# Patient Record
Sex: Male | Born: 1970
Health system: Southern US, Community
[De-identification: ages and names within clinical notes are randomized; demographics above are authoritative.]

## PROBLEM LIST (undated history)

## (undated) DIAGNOSIS — I10 Essential (primary) hypertension: Secondary | ICD-10-CM

## (undated) DIAGNOSIS — E7800 Pure hypercholesterolemia, unspecified: Secondary | ICD-10-CM

---

## 2009-05-30 ENCOUNTER — Encounter: Admission: RE | Admit: 2009-05-30 | Discharge: 2009-08-15 | Payer: Self-pay | Admitting: Family Medicine

## 2010-09-13 ENCOUNTER — Emergency Department (HOSPITAL_BASED_OUTPATIENT_CLINIC_OR_DEPARTMENT_OTHER): Admission: EM | Admit: 2010-09-13 | Discharge: 2010-09-13 | Payer: Self-pay | Admitting: Emergency Medicine

## 2010-09-13 ENCOUNTER — Ambulatory Visit: Payer: Self-pay | Admitting: Diagnostic Radiology

## 2011-03-14 LAB — URINALYSIS, ROUTINE W REFLEX MICROSCOPIC
Glucose, UA: NEGATIVE mg/dL
Ketones, ur: NEGATIVE mg/dL
Nitrite: NEGATIVE
Specific Gravity, Urine: 1.006 (ref 1.005–1.030)
pH: 7 (ref 5.0–8.0)

## 2011-03-14 LAB — BASIC METABOLIC PANEL
Chloride: 101 mEq/L (ref 96–112)
GFR calc Af Amer: >60 mL/min (ref >60)
GFR calc non Af Amer: >60 mL/min (ref >60)
Potassium: 4.3 mEq/L (ref 3.5–5.1)
Sodium: 144 mEq/L (ref 135–145)

## 2017-03-21 DIAGNOSIS — Z125 Encounter for screening for malignant neoplasm of prostate: Secondary | ICD-10-CM | POA: Diagnosis not present

## 2017-04-17 DIAGNOSIS — E785 Hyperlipidemia, unspecified: Secondary | ICD-10-CM | POA: Diagnosis not present

## 2017-04-17 DIAGNOSIS — E291 Testicular hypofunction: Secondary | ICD-10-CM | POA: Diagnosis not present

## 2017-04-17 DIAGNOSIS — I10 Essential (primary) hypertension: Secondary | ICD-10-CM | POA: Diagnosis not present

## 2017-05-22 DIAGNOSIS — E291 Testicular hypofunction: Secondary | ICD-10-CM | POA: Diagnosis not present

## 2017-08-19 DIAGNOSIS — E785 Hyperlipidemia, unspecified: Secondary | ICD-10-CM | POA: Diagnosis not present

## 2017-08-19 DIAGNOSIS — I10 Essential (primary) hypertension: Secondary | ICD-10-CM | POA: Diagnosis not present

## 2017-08-19 DIAGNOSIS — Z125 Encounter for screening for malignant neoplasm of prostate: Secondary | ICD-10-CM | POA: Diagnosis not present

## 2017-08-19 DIAGNOSIS — E291 Testicular hypofunction: Secondary | ICD-10-CM | POA: Diagnosis not present

## 2017-08-19 DIAGNOSIS — Z23 Encounter for immunization: Secondary | ICD-10-CM | POA: Diagnosis not present

## 2017-11-13 DIAGNOSIS — E291 Testicular hypofunction: Secondary | ICD-10-CM | POA: Diagnosis not present

## 2018-02-17 DIAGNOSIS — M9901 Segmental and somatic dysfunction of cervical region: Secondary | ICD-10-CM | POA: Diagnosis not present

## 2018-02-17 DIAGNOSIS — M542 Cervicalgia: Secondary | ICD-10-CM | POA: Diagnosis not present

## 2018-02-17 DIAGNOSIS — M9902 Segmental and somatic dysfunction of thoracic region: Secondary | ICD-10-CM | POA: Diagnosis not present

## 2018-03-04 DIAGNOSIS — I10 Essential (primary) hypertension: Secondary | ICD-10-CM | POA: Diagnosis not present

## 2018-03-04 DIAGNOSIS — E785 Hyperlipidemia, unspecified: Secondary | ICD-10-CM | POA: Diagnosis not present

## 2018-09-04 DIAGNOSIS — E291 Testicular hypofunction: Secondary | ICD-10-CM | POA: Diagnosis not present

## 2018-09-04 DIAGNOSIS — I10 Essential (primary) hypertension: Secondary | ICD-10-CM | POA: Diagnosis not present

## 2018-09-04 DIAGNOSIS — E785 Hyperlipidemia, unspecified: Secondary | ICD-10-CM | POA: Diagnosis not present

## 2018-12-24 DIAGNOSIS — R208 Other disturbances of skin sensation: Secondary | ICD-10-CM | POA: Diagnosis not present

## 2018-12-24 DIAGNOSIS — M7741 Metatarsalgia, right foot: Secondary | ICD-10-CM | POA: Diagnosis not present

## 2019-01-08 DIAGNOSIS — R208 Other disturbances of skin sensation: Secondary | ICD-10-CM | POA: Diagnosis not present

## 2019-02-02 DIAGNOSIS — R208 Other disturbances of skin sensation: Secondary | ICD-10-CM | POA: Diagnosis not present

## 2019-02-02 DIAGNOSIS — M7741 Metatarsalgia, right foot: Secondary | ICD-10-CM | POA: Diagnosis not present

## 2019-03-03 DIAGNOSIS — E785 Hyperlipidemia, unspecified: Secondary | ICD-10-CM | POA: Diagnosis not present

## 2019-03-03 DIAGNOSIS — E291 Testicular hypofunction: Secondary | ICD-10-CM | POA: Diagnosis not present

## 2019-03-03 DIAGNOSIS — I10 Essential (primary) hypertension: Secondary | ICD-10-CM | POA: Diagnosis not present

## 2019-05-03 DIAGNOSIS — R079 Chest pain, unspecified: Secondary | ICD-10-CM | POA: Diagnosis not present

## 2019-05-03 DIAGNOSIS — I10 Essential (primary) hypertension: Secondary | ICD-10-CM | POA: Diagnosis not present

## 2019-05-03 DIAGNOSIS — R0600 Dyspnea, unspecified: Secondary | ICD-10-CM | POA: Diagnosis not present

## 2019-05-04 ENCOUNTER — Other Ambulatory Visit: Payer: Self-pay

## 2019-05-04 ENCOUNTER — Emergency Department (HOSPITAL_BASED_OUTPATIENT_CLINIC_OR_DEPARTMENT_OTHER): Payer: Commercial Managed Care - PPO

## 2019-05-04 ENCOUNTER — Inpatient Hospital Stay (HOSPITAL_BASED_OUTPATIENT_CLINIC_OR_DEPARTMENT_OTHER)
Admission: EM | Admit: 2019-05-04 | Discharge: 2019-05-11 | DRG: 234 | Disposition: A | Discharge status: Home / Self Care | Payer: Commercial Managed Care - PPO | Attending: Surgery | Admitting: Surgery

## 2019-05-04 ENCOUNTER — Encounter (HOSPITAL_BASED_OUTPATIENT_CLINIC_OR_DEPARTMENT_OTHER): Payer: Self-pay | Admitting: Emergency Medicine

## 2019-05-04 DIAGNOSIS — I214 Non-ST elevation (NSTEMI) myocardial infarction: Secondary | ICD-10-CM

## 2019-05-04 DIAGNOSIS — Z888 Allergy status to other drugs, medicaments and biological substances status: Secondary | ICD-10-CM

## 2019-05-04 DIAGNOSIS — I10 Essential (primary) hypertension: Secondary | ICD-10-CM | POA: Diagnosis present

## 2019-05-04 DIAGNOSIS — Z01811 Encounter for preprocedural respiratory examination: Secondary | ICD-10-CM

## 2019-05-04 DIAGNOSIS — E669 Obesity, unspecified: Secondary | ICD-10-CM | POA: Diagnosis present

## 2019-05-04 DIAGNOSIS — R079 Chest pain, unspecified: Secondary | ICD-10-CM | POA: Diagnosis present

## 2019-05-04 DIAGNOSIS — E7800 Pure hypercholesterolemia, unspecified: Secondary | ICD-10-CM | POA: Diagnosis present

## 2019-05-04 DIAGNOSIS — Z20828 Contact with and (suspected) exposure to other viral communicable diseases: Secondary | ICD-10-CM | POA: Diagnosis present

## 2019-05-04 DIAGNOSIS — D62 Acute posthemorrhagic anemia: Secondary | ICD-10-CM | POA: Diagnosis not present

## 2019-05-04 DIAGNOSIS — Z8249 Family history of ischemic heart disease and other diseases of the circulatory system: Secondary | ICD-10-CM | POA: Diagnosis not present

## 2019-05-04 DIAGNOSIS — R739 Hyperglycemia, unspecified: Secondary | ICD-10-CM

## 2019-05-04 DIAGNOSIS — Z6829 Body mass index (BMI) 29.0-29.9, adult: Secondary | ICD-10-CM | POA: Diagnosis not present

## 2019-05-04 DIAGNOSIS — Z79899 Other long term (current) drug therapy: Secondary | ICD-10-CM | POA: Diagnosis not present

## 2019-05-04 DIAGNOSIS — I200 Unstable angina: Secondary | ICD-10-CM

## 2019-05-04 DIAGNOSIS — Z951 Presence of aortocoronary bypass graft: Secondary | ICD-10-CM

## 2019-05-04 DIAGNOSIS — E785 Hyperlipidemia, unspecified: Secondary | ICD-10-CM | POA: Diagnosis present

## 2019-05-04 DIAGNOSIS — Z09 Encounter for follow-up examination after completed treatment for conditions other than malignant neoplasm: Secondary | ICD-10-CM

## 2019-05-04 DIAGNOSIS — E876 Hypokalemia: Secondary | ICD-10-CM | POA: Diagnosis present

## 2019-05-04 DIAGNOSIS — R0602 Shortness of breath: Secondary | ICD-10-CM | POA: Diagnosis not present

## 2019-05-04 DIAGNOSIS — R42 Dizziness and giddiness: Secondary | ICD-10-CM | POA: Diagnosis not present

## 2019-05-04 DIAGNOSIS — I25110 Atherosclerotic heart disease of native coronary artery with unstable angina pectoris: Secondary | ICD-10-CM | POA: Diagnosis present

## 2019-05-04 DIAGNOSIS — R0981 Nasal congestion: Secondary | ICD-10-CM | POA: Diagnosis not present

## 2019-05-04 DIAGNOSIS — E1165 Type 2 diabetes mellitus with hyperglycemia: Secondary | ICD-10-CM | POA: Diagnosis not present

## 2019-05-04 HISTORY — DX: Pure hypercholesterolemia, unspecified: E78.00

## 2019-05-04 HISTORY — DX: Essential (primary) hypertension: I10

## 2019-05-04 LAB — HEMOGLOBIN A1C
Hgb A1c MFr Bld: 5.2 % (ref 4.8–5.6)
Mean Plasma Glucose: 102.54 mg/dL

## 2019-05-04 LAB — TROPONIN I
Troponin I: <0.03 ng/mL (ref <0.03)
Troponin I: 0.1 ng/mL (ref <0.03)
Troponin I: 0.09 ng/mL (ref <0.03)

## 2019-05-04 LAB — CBC
HCT: 47.2 % (ref 39.0–52.0)
Hemoglobin: 15.5 g/dL (ref 13.0–17.0)
MCH: 26 pg (ref 26.0–34.0)
MCHC: 32.8 g/dL (ref 30.0–36.0)
MCV: 79.1 fL — ABNORMAL LOW (ref 80.0–100.0)
Platelets: 268 10*3/uL (ref 150–400)
RBC: 5.97 MIL/uL — ABNORMAL HIGH (ref 4.22–5.81)
RDW: 13.1 % (ref 11.5–15.5)
WBC: 7.5 10*3/uL (ref 4.0–10.5)
nRBC: 0 % (ref 0.0–0.2)

## 2019-05-04 LAB — BASIC METABOLIC PANEL
Anion gap: 14 (ref 5–15)
BUN: 13 mg/dL (ref 6–20)
CO2: 20 mmol/L — ABNORMAL LOW (ref 22–32)
Calcium: 9.5 mg/dL (ref 8.9–10.3)
Chloride: 103 mmol/L (ref 98–111)
Creatinine, Ser: 1.03 mg/dL (ref 0.61–1.24)
GFR calc Af Amer: >60 mL/min (ref >60)
GFR calc non Af Amer: >60 mL/min (ref >60)
Glucose, Bld: 143 mg/dL — ABNORMAL HIGH (ref 70–99)
Potassium: 3.3 mmol/L — ABNORMAL LOW (ref 3.5–5.1)
Sodium: 137 mmol/L (ref 135–145)

## 2019-05-04 LAB — RAPID URINE DRUG SCREEN, HOSP PERFORMED
Amphetamines: NOT DETECTED
Barbiturates: NOT DETECTED
Benzodiazepines: NOT DETECTED
Cocaine: NOT DETECTED
Opiates: NOT DETECTED
Tetrahydrocannabinol: NOT DETECTED

## 2019-05-04 LAB — SARS CORONAVIRUS 2 AG (30 MIN TAT): SARS Coronavirus 2 Ag: NEGATIVE

## 2019-05-04 LAB — GLUCOSE, CAPILLARY
Glucose-Capillary: 88 mg/dL (ref 70–99)
Glucose-Capillary: 89 mg/dL (ref 70–99)

## 2019-05-04 LAB — TSH: TSH: 1.804 u[IU]/mL (ref 0.350–4.500)

## 2019-05-04 MED ORDER — INSULIN ASPART 100 UNIT/ML ~~LOC~~ SOLN: 0.0000 [IU] | Freq: Three times a day (TID) | SUBCUTANEOUS | Status: DC

## 2019-05-04 MED ORDER — ONDANSETRON HCL 4 MG/2ML IJ SOLN: 4.0000 mg | Freq: Four times a day (QID) | INTRAMUSCULAR | Status: DC | PRN

## 2019-05-04 MED ORDER — HYDRALAZINE HCL 20 MG/ML IJ SOLN: 5.0000 mg | INTRAMUSCULAR | Status: DC | PRN

## 2019-05-04 MED ORDER — ACETAMINOPHEN 325 MG PO TABS: 650.0000 mg | ORAL_TABLET | ORAL | Status: DC | PRN

## 2019-05-04 MED ORDER — ATORVASTATIN CALCIUM 10 MG PO TABS
10.0000 mg | ORAL_TABLET | Freq: Every day | ORAL | Status: DC
  Administered 2019-05-04: 10 mg via ORAL
  Filled 2019-05-04: qty 1

## 2019-05-04 MED ORDER — LOSARTAN POTASSIUM 50 MG PO TABS
100.0000 mg | ORAL_TABLET | Freq: Every day | ORAL | Status: DC
  Administered 2019-05-05: 100 mg via ORAL
  Filled 2019-05-04 (×2): qty 2

## 2019-05-04 MED ORDER — ASPIRIN 81 MG PO CHEW
243.0000 mg | CHEWABLE_TABLET | Freq: Once | ORAL | Status: AC
  Administered 2019-05-04: 243 mg via ORAL
  Filled 2019-05-04: qty 3

## 2019-05-04 MED ORDER — AMLODIPINE BESYLATE 10 MG PO TABS
10.0000 mg | ORAL_TABLET | Freq: Every day | ORAL | Status: DC
  Administered 2019-05-05: 10 mg via ORAL
  Filled 2019-05-04: qty 1

## 2019-05-04 MED ORDER — ENOXAPARIN SODIUM 40 MG/0.4ML ~~LOC~~ SOLN
40.0000 mg | SUBCUTANEOUS | Status: DC
  Administered 2019-05-04: 40 mg via SUBCUTANEOUS
  Filled 2019-05-04: qty 0.4

## 2019-05-04 MED ORDER — ASPIRIN EC 81 MG PO TBEC
81.0000 mg | DELAYED_RELEASE_TABLET | Freq: Every day | ORAL | Status: DC
  Administered 2019-05-05: 81 mg via ORAL
  Filled 2019-05-04: qty 1

## 2019-05-04 MED ORDER — LOSARTAN POTASSIUM-HCTZ 100-12.5 MG PO TABS
1.0000 | ORAL_TABLET | Freq: Every day | ORAL | Status: DC
Start: 2019-05-05 — End: 2019-05-04

## 2019-05-04 MED ORDER — HYDROCHLOROTHIAZIDE 12.5 MG PO CAPS
12.5000 mg | ORAL_CAPSULE | Freq: Every day | ORAL | Status: DC
  Administered 2019-05-05: 12.5 mg via ORAL
  Filled 2019-05-04: qty 1

## 2019-05-04 MED ORDER — ATENOLOL 25 MG PO TABS
100.0000 mg | ORAL_TABLET | Freq: Every day | ORAL | Status: DC
  Administered 2019-05-05: 100 mg via ORAL
  Filled 2019-05-04: qty 4

## 2019-05-04 MED ORDER — MORPHINE SULFATE (PF) 2 MG/ML IV SOLN: 2.0000 mg | INTRAVENOUS | Status: DC | PRN

## 2019-05-04 MED ORDER — POTASSIUM CHLORIDE CRYS ER 20 MEQ PO TBCR
40.0000 meq | EXTENDED_RELEASE_TABLET | Freq: Once | ORAL | Status: AC
  Administered 2019-05-04: 40 meq via ORAL
  Filled 2019-05-04: qty 2

## 2019-05-04 NOTE — ED Notes (Signed)
ED Provider at bedside. 

## 2019-05-04 NOTE — ED Notes (Signed)
IV attempt x2 unsuccessful, blood obtained for labs.  RN aware

## 2019-05-04 NOTE — ED Provider Notes (Signed)
[facility] EMERGENCY DEPARTMENT Provider Note   CSN: [id number] Arrival date & time: 05/04/19  8144    History   Chief Complaint Chief Complaint  Patient presents with   Chest Pain    HPI Ezykiel Lynde is a 48 y.o. male.  HPI: A 48 year old patient with a history of hypertension, hypercholesterolemia and obesity presents for evaluation of chest pain. Initial onset of pain was less than one hour ago. The patient's chest pain is described as heaviness/pressure/tightness, is sharp and is worse with exertion. The patient's chest pain is middle- or left-sided, is not well-localized and does radiate to the arms/jaw/neck. The patient does not complain of nausea and denies diaphoresis. The patient has no history of stroke, has no history of peripheral artery disease, has not smoked in the past 90 days, denies any history of treated diabetes and has no relevant family history of coronary artery disease (first degree relative at less than age 37).   Mitchel Blache is a 48 y.o. male with a history of hypertension and hyperlipidemia, who presents to the emergency department for evaluation of chest pain.  Current chest pain episode started less than 1 hour ago, it began as he was walking a short distance to the car, we sat down in the car pain improved but did not completely resolve and he noticed radiation into his left hand and arm with some numbness and tingling as well as numbness and tingling up through the jaw and cheeks.  Patient has had some similar episodes of chest pain with exertion over the past 3 weeks but they typically resolve completely with rest, and he has never had the numbness and tingling up into his face and cheeks.  He did feel a bit short of breath and lightheaded when pain became severe, at maximum intensity reached a 9/10, now currently a 3/10.  He denies diaphoresis, nausea or vomiting.  Patient reports that he saw his primary care doctor regarding these episodes of chest pain  yesterday had a reassuring EKG at that time and was given outpatient cardiology referral, he was contacted today to schedule appointment but then had this worsening episode of chest pain this morning and presented to the emergency department.  He has never seen a cardiologist before he is on medication to manage his blood pressure and cholesterol, prior to these 3 weeks he has never had issues with chest pain.  No smoking history.  Denies drug use.  No significant family history of heart disease.     Past Medical History:  Diagnosis Date   High cholesterol    Hypertension     There are no active problems to display for this patient.   History reviewed. No pertinent surgical history.      Home Medications    Prior to Admission medications   Medication Sig Start Date End Date Taking? Authorizing Provider  testosterone (ANDROGEL) 50 MG/5GM (1%) GEL  10/09/18  Yes [provider]  amLODipine (NORVASC) 10 MG tablet  04/06/19   [provider]  atenolol (TENORMIN) 100 MG tablet  04/06/19   [provider]  atorvastatin (LIPITOR) 10 MG tablet Take by mouth.    [provider]  cyclobenzaprine (FLEXERIL) 10 MG tablet Take by mouth.    [provider]  losartan-hydrochlorothiazide (HYZAAR) 100-12.5 MG tablet Take by mouth.    [provider]    Family History No family history on file.  Social History Social History   Tobacco Use   Smoking  status: Never Smoker   Smokeless tobacco: Never Used  Substance Use Topics   Alcohol use: Never    Frequency: Never   Drug use: Never     Allergies   Azithromycin   Review of Systems Review of Systems  Constitutional: Negative for chills, diaphoresis, fatigue and fever.  HENT: Negative.   Eyes: Negative for visual disturbance.  Respiratory: Positive for shortness of breath. Negative for cough and chest tightness.   Cardiovascular: Positive for chest pain. Negative for  palpitations and leg swelling.  Gastrointestinal: Negative for abdominal pain, nausea and vomiting.  Genitourinary: Negative for dysuria and flank pain.  Musculoskeletal: Negative for arthralgias and myalgias.  Skin: Negative for color change and rash.  Neurological: Positive for light-headedness and numbness. Negative for dizziness, syncope, weakness and headaches.     Physical Exam Updated Vital Signs BP (!) 162/101 (BP Location: Right Arm)    Pulse 75    Temp (!) 97.5 F (36.4 C) (Oral)    Resp 12    Ht  (1.778 m)    Wt 96.2 kg    SpO2 100%    BMI 30.42 kg/m   Physical Exam Vitals signs and nursing note reviewed.  Constitutional:      General: He is not in acute distress.    Appearance: He is well-developed. He is obese. He is not ill-appearing, toxic-appearing or diaphoretic.  HENT:     Head: Normocephalic and atraumatic.  Eyes:     General:        Right eye: No discharge.        Left eye: No discharge.     Pupils: Pupils are equal, round, and reactive to light.  Neck:     Musculoskeletal: Neck supple.     Vascular: No JVD.     Trachea: No tracheal deviation.  Cardiovascular:     Rate and Rhythm: Normal rate and regular rhythm.     Pulses:          Radial pulses are 2+ on the right side and 2+ on the left side.       Dorsalis pedis pulses are 2+ on the right side and 2+ on the left side.     Heart sounds: Normal heart sounds. No murmur. No friction rub. No gallop.   Pulmonary:     Effort: Pulmonary effort is normal. No respiratory distress.     Breath sounds: Normal breath sounds. No wheezing or rales.     Comments: Respirations equal and unlabored, patient able to speak in full sentences, lungs clear to auscultation bilaterally Chest:     Chest wall: No tenderness.     Comments: Chest pain not reproducible with palpation Abdominal:     General: Bowel sounds are normal. There is no distension.     Palpations: Abdomen is soft. There is no mass.     Tenderness:  There is no abdominal tenderness. There is no guarding.  Musculoskeletal:        General: No deformity.  Skin:    General: Skin is warm and dry.     Capillary Refill: Capillary refill takes less than 2 seconds.  Neurological:     Mental Status: He is alert and oriented to person, place, and time.     Coordination: Coordination normal.     Comments: Speech is clear, able to follow commands Moves extremities without ataxia, coordination intact   Psychiatric:        Mood and Affect: Mood normal.  Behavior: Behavior normal.      ED Treatments / Results  Labs (all labs ordered are listed, but only abnormal results are displayed) Labs Reviewed  BASIC METABOLIC PANEL - Abnormal; Notable for the following components:      Result Value   Potassium 3.3 (*)    CO2 20 (*)    Glucose, Bld 143 (*)    All other components within normal limits  CBC - Abnormal; Notable for the following components:   RBC 5.97 (*)    MCV 79.1 (*)    All other components within normal limits  SARS CORONAVIRUS 2 (HOSP ORDER, PERFORMED IN [place] [facility] VIA ABBOTT ID)  TROPONIN I    EKG EKG Interpretation  Date/Time:  Tuesday May 04 2019 10:02:03 EDT Ventricular Rate:  79 PR Interval:    QRS Duration: 103 QT Interval:  412 QTC Calculation: 473 R Axis:   72 Text Interpretation:  Sinus rhythm Abnormal inferior Q waves Borderline ST depression, anterior leads Baseline wander in lead(s) V5 Confirmed by Vanetta Mulders [phone]) on 05/04/2019 10:09:42 AM   Radiology Dg Chest Port 1 View  Result Date: 05/04/2019 CLINICAL DATA:  Chest pain. EXAM: PORTABLE CHEST 1 VIEW COMPARISON:  None. FINDINGS: The heart size and mediastinal contours are within normal limits. Both lungs are clear. The visualized skeletal structures are unremarkable. IMPRESSION: No active disease. Electronically Signed   By: Obie Dredge M.D.   On: 05/04/2019 10:44    Procedures Procedures (including critical care  time)  Medications Ordered in ED Medications  potassium chloride SA (K-DUR) CR tablet 40 mEq (has no administration in time range)  aspirin chewable tablet 243 mg (243 mg Oral Given 05/04/19 1035)     Initial Impression / Assessment and Plan / ED Course  I have reviewed the triage vital signs and the nursing notes.  Pertinent labs & imaging results that were available during my care of the patient were reviewed by me and considered in my medical decision making (see chart for details).  Patient presents with 3 weeks of exertional chest pain, today had episode after walking a very short distance to the car that did not immediately resolve with rest and he began to have pain radiating into his left arm and through his neck to his cheeks with some numbness and tingling.  Since patient has been resting here in the emergency department pain is now a 1/10 and he has no further numbness or tingling.  He did not experience diaphoresis, nausea or vomiting.  Story is certainly concerning for potential unstable angina.  His EKG shows some borderline ST depressions in the anterior leads as well as some slight inferior Q waves but no ST elevations to suggest STEMI.  Otherwise shows sinus rhythm, no new bundle branch blocks.  Given that his pain has significantly improved, will check basic labs, troponin and chest x-ray and give additional 3 tablets of 81 mg aspirin since he took 1 just prior to arrival at home.  Patient does not have significant risk factors for PE and the story seems much more consistent with cardiac pain.  He does not have any epigastric tenderness, nausea or vomiting, he initially thought the symptoms may be related to reflux and has started daily Pepcid for this without improvement in his symptoms and I feel this is less likely.  Presentation not suggestive of aortic dissection.  No fever cough or persistent shortness of breath, no recent sick contacts.  Feel pneumonia is unlikely and patient  is  low risk for COVID-19 infection.  Patient does have multiple cardiac risk factors including hypertension, hyperlipidemia and obesity.  No smoking history.  HEART Score: 5  Initial work-up is reassuring, EKG as described above, chest x-ray shows no active cardiopulmonary disease, images reviewed by myself.  Initial troponin is negative.  No leukocytosis, normal hemoglobin, potassium is 3.3, mild hypokalemia treated with p.o. potassium replacement, glucose slightly elevated at 143, patient is not fasting, CO2 of 20.  Normal anion gap and normal renal function.  Initial work-up is reassuring but with heart score of 5 feel patient would benefit from admission for further cardiac work-up, I discussed case initially with Dr. Duke Salviaandolph with cardiology given patient's symptoms concerning for unstable angina but given negative enzymes and that patient is currently chest pain-free she did not feel that early cath was warranted at this time and recommended medicine admission for stress testing.  Will consult hospitalist for admission.  Discussed plan with patient, also called and updated patient's wife and they are in agreement.  Patient discussed with Dr. Ophelia CharterYates, who accepts the patient for transfer and [facility] pain [facility].  Final Clinical Impressions(s) / ED Diagnoses   Final diagnoses:  Unstable angina [facility](HCC)    ED Discharge Orders    None       Dartha LodgeFord, Josalynn Johndrow N, [place]-C 05/04/19 1144    Vanetta MuldersZackowski, Scott, MD 05/05/19 [phone]

## 2019-05-04 NOTE — H&P (Signed)
History and Physical    Edward Lawrence Stryker ZOX:[id number] DOB: 10/04/1971 DOA: 05/04/2019  PCP: Deatra JamesSun, Vyvyan, MD Consultants:  Dial - podiatry Patient coming from:  Home - lives with wife and 2 kids; NOK: Wife, Lawson FiscalLori, [phone]  Chief Complaint:  Chest pain  HPI: Edward Lawrence Perrell is a 48 y.o. male with medical history significant of HTN and HLD [facility].  He reports intermittent CP x 3 weeks.  He thought it was heart burn and changed his diet and starting taking antacids - Pepcid AC.  Sometimes the pain comes on and radiates into his arms while walking and he has to stop to take a break. This AM, he had pain while driving - substernal into the left arm/shoulder and left cheek tingling.  The pain has bene non-exertional at times.  This AM< he woke up with pain.  However, exercise or exertion will almost always elicit the pain.  No N/V.  ?SOB.   FH - father with HTN and valve replacement.  No h/o stress test.    ED Course:   [facility] for this 48 yo male with h/o HTN and HLD presenting with chest pain. No prior h/o CAD, has not seen cards. 3 weeks of exertional CP, referred for outpatient cardiology evaluation. Today, developed 9/10 CP with radiation while walking. Chest pain free after rest. EKG nonspecific. Troponin negative. Dr. Duke [facility]olph [facility] with plan for stress test. He has been given ASA.   Review of Systems: As per HPI; otherwise review of systems reviewed and negative.   Ambulatory Status:  Ambulates without assistance  Past Medical History:  Diagnosis Date  . High cholesterol   . Hypertension     History reviewed. No pertinent surgical history.  Social History   Socioeconomic History  . Marital status: Married    Spouse name: Not on file  . Number of children: Not on file  . Years of education: Not on file  . Highest education level: Not on file  Occupational History  . 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  Financial resource strain: Not on file  . Food insecurity:    Worry: Not on file    Inability: Not on file  . Transportation needs:    Medical: Not on file    Non-medical: Not on file  Tobacco Use  . Smoking status: Never Smoker  . Smokeless tobacco: Never Used  Substance and Sexual Activity  . Alcohol use: Never    Frequency: Never  . Drug use: Never  . Sexual activity: Not on file  Lifestyle  . Physical activity:    Days per week: Not on file    Minutes per session: Not on file  . Stress: Not on file  Relationships  . Social connections:    Talks on phone: Not on file    Gets together: Not on file    Attends religious service: Not on file    Active member of club or organization: Not on file    Attends meetings of clubs or organizations: Not on file    Relationship status: Not on file  . Intimate partner violence:    Fear of current or ex partner: Not on file    Emotionally abused: Not on file    Physically abused: Not on file    Forced sexual activity: Not on file  Other Topics Concern  . Not on file  Social History Narrative  . Not on file  Allergies  Allergen Reactions  . Azithromycin Other (See Comments)    Spikes BP    Family History  Problem Relation Age of Onset  . Other Mother 78       Shy-Drager Syndrome  . Hypertension Father   . Valvular heart disease Father     Prior to Admission medications   Medication Sig Start Date End Date Taking? Authorizing Provider  testosterone (ANDROGEL) 50 MG/5GM (1%) GEL  10/09/18  Yes [provider]  amLODipine (NORVASC) 10 MG tablet  04/06/19   [provider]  atenolol (TENORMIN) 100 MG tablet  04/06/19   [provider]  atorvastatin (LIPITOR) 10 MG tablet Take by mouth.    [provider]  cyclobenzaprine (FLEXERIL) 10 MG tablet Take by mouth.    [provider]  losartan-hydrochlorothiazide (HYZAAR) 100-12.5 MG tablet Take by mouth.    [provider]     Physical Exam: Vitals:   05/04/19 1004 05/04/19 1005 05/04/19 1230 05/04/19 1419  BP: (!) 162/101  122/78 (!) 161/94  Pulse: 75  (!) 59 66  Resp: 12  11 17   Temp: (!) 97.5 F (36.4 C)   98.1 F (36.7 C)  TempSrc: Oral   Oral  SpO2: 100%  100% 98%  Weight:  96.2 kg    Height:  5\' 10"  (1.778 m)       . General:  Appears calm and comfortable and is NAD . Eyes:   EOMI, normal lids, iris . ENT:  grossly normal hearing, lips & tongue, mmm . Neck:  no LAD, masses or thyromegaly; no carotid bruits . Cardiovascular:  RRR, no m/r/g. No LE edema.  Marland Kitchen Respiratory:   CTA bilaterally with no wheezes/rales/rhonchi.  Normal respiratory effort. . Abdomen:  soft, NT, ND, NABS . Back:   normal alignment, no CVAT . Skin:  no rash or induration seen on limited exam . Musculoskeletal:  grossly normal tone BUE/BLE, good ROM, no bony abnormality . Lower extremity:  No LE edema.  Limited foot exam with no ulcerations.  2+ distal pulses. Marland Kitchen Psychiatric:  grossly normal mood and affect, speech fluent and appropriate, AOx3 . Neurologic:  CN 2-12 grossly intact, moves all extremities in coordinated fashion, sensation intact    Radiological Exams on Admission: Dg Chest Port 1 View  Result Date: 05/04/2019 CLINICAL DATA:  Chest pain. EXAM: PORTABLE CHEST 1 VIEW COMPARISON:  None. FINDINGS: The heart size and mediastinal contours are within normal limits. Both lungs are clear. The visualized skeletal structures are unremarkable. IMPRESSION: No active disease. Electronically Signed   By: Obie Dredge M.D.   On: 05/04/2019 10:44    EKG: Independently reviewed.  NSR with rate 79; nonspecific ST changes with no evidence of acute ischemia   Labs on Admission: I have personally reviewed the available labs and imaging studies at the time of the admission.  Pertinent labs:   Glucose 143 Troponin 0.03 WBC 7.5, unremarkable CBC   Assessment/Plan Principal Problem:   Chest pain Active Problems:    Hypertension   High cholesterol   Hyperglycemia   Chest pain -Patient with substernal chest pressure that has come on intermittently for a few weeks, mostly exertional, relieved by rest. -3/3 typical symptoms suggestive of typical cardiac chest pain.  -CXR unremarkable.   -Initial cardiac troponin negative.  -EKG not indicative of acute ischemia.   -HEART score is 4, indicating that the patient has an elevated risk score and requires further evaluation. -Will plan to place in observation  status on telemetry to rule out ACS by overnight observation.  -cycle troponin q6h x 3 and repeat EKG in AM -Start ASA 81 mg daily -morphine given -Risk factor stratification with HgbA1c and FLP; will also check TSH and UDS -Cardiology consultation -NPO for possible stress test   HTN -He is on multidrug therapy including Norvasc, Atenolol, Hyzaar -Will continue -Will add hydralazine if needed for uncontrolled HTN  HLD -Continue Lipitor -Will check lipids  DM -Will check A1c -There is no indication to start medication at this time    Note: This patient has been tested and is negative for the novel coronavirus COVID-19.    DVT prophylaxis: Lovenox  Code Status:  FULL - confirmed with patient Family Communication: None present Disposition Plan:  Home once clinically improved Consults called: Cardiology Admission status:   It is my clinical opinion that referral for OBSERVATION is reasonable and necessary in this patient based on the above information provided. The aforementioned taken together are felt to place the patient at high risk for further clinical deterioration. However it is anticipated that the patient may be medically stable for discharge from the hospital within 24 to 48 hours.       Jonah Blue MD [facility]   How to contact the [facility] Attending or Consulting provider 7A - 7P or covering provider during after hours 7P -7A, for this patient?  1. Check the care  team in [facility] and look for a) attending/consulting [facility] provider listed and b) the [facility] team listed 2. Log into [email or web address] and use [place] universal password to access. If you do not have the password, please contact the hospital operator. 3. Locate the [facility] provider you are looking for under [facility] and page to a number that you can be directly reached. 4. If you still have difficulty reaching the provider, please page the [facility] (Director on Call) for the Hospitalists listed on amion for assistance.   05/04/2019, 3:13 PM

## 2019-05-04 NOTE — Progress Notes (Signed)
Called by [facility] for admission for this 48 yo male with h/o HTN and HLD presenting with chest pain.  No prior h/o CAD, has not seen cards.  3 weeks of exertional CP, referred for outpatient cardiology evaluation.  Today, developed 9/10 CP with radiation while walking.  Chest pain free after rest.  EKG nonspecific.  Troponin negative.  Dr. Duke Salvia recommends [facility] admission with plan for stress test.  He has been given ASA.  Will place in observation, telemetry.   Georgana Curio, M.D.

## 2019-05-04 NOTE — ED Triage Notes (Addendum)
Episodes of sharp substernal CP x3 weeks, with exertion and when he rests it subsides.  Today he was walking to the car when it started.  His left hand went numb and he started having numbness and tingling in left arm with some tingling in right arm as well.  Was having some tingling in both cheeks. Was seen yesterday by pmd for same.

## 2019-05-04 NOTE — Progress Notes (Signed)
CRITICAL VALUE ALERT  Critical Value:  Troponin 0.10  Date & Time Notied:  05/04/2019 1842  Provider Notified: Yes  Orders Received/Actions taken:

## 2019-05-05 ENCOUNTER — Inpatient Hospital Stay (HOSPITAL_COMMUNITY): Payer: Commercial Managed Care - PPO

## 2019-05-05 ENCOUNTER — Encounter (HOSPITAL_COMMUNITY)
Admission: EM | Disposition: A | Discharge status: Home / Self Care | Payer: Self-pay | Source: Home / Self Care | Attending: Surgery

## 2019-05-05 DIAGNOSIS — Z20828 Contact with and (suspected) exposure to other viral communicable diseases: Secondary | ICD-10-CM | POA: Diagnosis present

## 2019-05-05 DIAGNOSIS — I214 Non-ST elevation (NSTEMI) myocardial infarction: Secondary | ICD-10-CM | POA: Diagnosis not present

## 2019-05-05 DIAGNOSIS — E876 Hypokalemia: Secondary | ICD-10-CM | POA: Diagnosis present

## 2019-05-05 DIAGNOSIS — Z951 Presence of aortocoronary bypass graft: Secondary | ICD-10-CM | POA: Diagnosis not present

## 2019-05-05 DIAGNOSIS — I2510 Atherosclerotic heart disease of native coronary artery without angina pectoris: Secondary | ICD-10-CM

## 2019-05-05 DIAGNOSIS — I10 Essential (primary) hypertension: Secondary | ICD-10-CM | POA: Diagnosis not present

## 2019-05-05 DIAGNOSIS — Z01810 Encounter for preprocedural cardiovascular examination: Secondary | ICD-10-CM | POA: Diagnosis not present

## 2019-05-05 DIAGNOSIS — I25110 Atherosclerotic heart disease of native coronary artery with unstable angina pectoris: Secondary | ICD-10-CM | POA: Diagnosis not present

## 2019-05-05 DIAGNOSIS — D62 Acute posthemorrhagic anemia: Secondary | ICD-10-CM | POA: Diagnosis not present

## 2019-05-05 DIAGNOSIS — Z6829 Body mass index (BMI) 29.0-29.9, adult: Secondary | ICD-10-CM | POA: Diagnosis not present

## 2019-05-05 DIAGNOSIS — Z79899 Other long term (current) drug therapy: Secondary | ICD-10-CM | POA: Diagnosis not present

## 2019-05-05 DIAGNOSIS — Z8249 Family history of ischemic heart disease and other diseases of the circulatory system: Secondary | ICD-10-CM | POA: Diagnosis not present

## 2019-05-05 DIAGNOSIS — E669 Obesity, unspecified: Secondary | ICD-10-CM | POA: Diagnosis present

## 2019-05-05 DIAGNOSIS — R0981 Nasal congestion: Secondary | ICD-10-CM | POA: Diagnosis present

## 2019-05-05 DIAGNOSIS — E7800 Pure hypercholesterolemia, unspecified: Secondary | ICD-10-CM | POA: Diagnosis not present

## 2019-05-05 DIAGNOSIS — I200 Unstable angina: Secondary | ICD-10-CM

## 2019-05-05 DIAGNOSIS — Z888 Allergy status to other drugs, medicaments and biological substances status: Secondary | ICD-10-CM | POA: Diagnosis not present

## 2019-05-05 DIAGNOSIS — E785 Hyperlipidemia, unspecified: Secondary | ICD-10-CM | POA: Diagnosis present

## 2019-05-05 DIAGNOSIS — E1165 Type 2 diabetes mellitus with hyperglycemia: Secondary | ICD-10-CM | POA: Diagnosis present

## 2019-05-05 HISTORY — PX: LEFT HEART CATH AND CORONARY ANGIOGRAPHY: CATH118249

## 2019-05-05 LAB — URINALYSIS, ROUTINE W REFLEX MICROSCOPIC
Bilirubin Urine: NEGATIVE
Glucose, UA: NEGATIVE mg/dL
Hgb urine dipstick: NEGATIVE
Ketones, ur: NEGATIVE mg/dL
Leukocytes,Ua: NEGATIVE
Nitrite: NEGATIVE
Protein, ur: NEGATIVE mg/dL
Specific Gravity, Urine: 1.02 (ref 1.005–1.030)
pH: 8 (ref 5.0–8.0)

## 2019-05-05 LAB — BLOOD GAS, ARTERIAL
Acid-base deficit: 0.6 mmol/L (ref 0.0–2.0)
Bicarbonate: 23.1 mmol/L (ref 20.0–28.0)
Drawn by: 44166
FIO2: 21
O2 Saturation: 96.4 %
Patient temperature: 98.6
pCO2 arterial: 35 mmHg (ref 32.0–48.0)
pH, Arterial: 7.434 (ref 7.350–7.450)
pO2, Arterial: 86.2 mmHg (ref 83.0–108.0)

## 2019-05-05 LAB — GLUCOSE, CAPILLARY
Glucose-Capillary: 160 mg/dL — ABNORMAL HIGH (ref 70–99)
Glucose-Capillary: 85 mg/dL (ref 70–99)
Glucose-Capillary: 87 mg/dL (ref 70–99)
Glucose-Capillary: 87 mg/dL (ref 70–99)
Glucose-Capillary: 88 mg/dL (ref 70–99)
Glucose-Capillary: 89 mg/dL (ref 70–99)

## 2019-05-05 LAB — HIV ANTIBODY (ROUTINE TESTING W REFLEX): HIV Screen 4th Generation wRfx: NONREACTIVE

## 2019-05-05 LAB — ECHOCARDIOGRAM LIMITED
Height: 70 in
Weight: 3278.4 oz

## 2019-05-05 LAB — APTT: aPTT: 38 seconds — ABNORMAL HIGH (ref 24–36)

## 2019-05-05 LAB — TYPE AND SCREEN
ABO/RH(D): O NEG
Antibody Screen: NEGATIVE

## 2019-05-05 LAB — PROTIME-INR
INR: 1 (ref 0.8–1.2)
Prothrombin Time: 13.5 seconds (ref 11.4–15.2)

## 2019-05-05 LAB — HEMOGLOBIN A1C
Hgb A1c MFr Bld: 5.2 % (ref 4.8–5.6)
Mean Plasma Glucose: 102.54 mg/dL

## 2019-05-05 LAB — TROPONIN I: Troponin I: 0.06 ng/mL (ref <0.03)

## 2019-05-05 LAB — LIPID PANEL
Cholesterol: 156 mg/dL (ref 0–200)
HDL: 34 mg/dL — ABNORMAL LOW (ref >40)
LDL Cholesterol: 101 mg/dL — ABNORMAL HIGH (ref 0–99)
Total CHOL/HDL Ratio: 4.6 RATIO
Triglycerides: 107 mg/dL (ref <150)
VLDL: 21 mg/dL (ref 0–40)

## 2019-05-05 LAB — ABO/RH: ABO/RH(D): O NEG

## 2019-05-05 SURGERY — LEFT HEART CATH AND CORONARY ANGIOGRAPHY
Anesthesia: LOCAL

## 2019-05-05 MED ORDER — TRANEXAMIC ACID (OHS) PUMP PRIME SOLUTION
2.0000 mg/kg | INTRAVENOUS | Status: DC
  Filled 2019-05-05: qty 1.86

## 2019-05-05 MED ORDER — HEPARIN SODIUM (PORCINE) 1000 UNIT/ML IJ SOLN
INTRAMUSCULAR | Status: AC
  Filled 2019-05-05: qty 1

## 2019-05-05 MED ORDER — HEPARIN (PORCINE) IN NACL 1000-0.9 UT/500ML-% IV SOLN
INTRAVENOUS | Status: DC | PRN
  Administered 2019-05-05: 500 mL

## 2019-05-05 MED ORDER — ASPIRIN 81 MG PO CHEW: 81.0000 mg | CHEWABLE_TABLET | ORAL | Status: DC

## 2019-05-05 MED ORDER — METOPROLOL TARTRATE 12.5 MG HALF TABLET
12.5000 mg | ORAL_TABLET | Freq: Once | ORAL | Status: AC
  Administered 2019-05-06: 12.5 mg via ORAL
  Filled 2019-05-05: qty 1

## 2019-05-05 MED ORDER — CHLORHEXIDINE GLUCONATE 0.12 % MT SOLN
15.0000 mL | Freq: Once | OROMUCOSAL | Status: AC
  Administered 2019-05-06: 15 mL via OROMUCOSAL
  Filled 2019-05-05: qty 15

## 2019-05-05 MED ORDER — MUPIROCIN 2 % EX OINT
1.0000 "application " | TOPICAL_OINTMENT | Freq: Two times a day (BID) | CUTANEOUS | Status: DC
  Administered 2019-05-06: 1 via NASAL
  Filled 2019-05-05: qty 22

## 2019-05-05 MED ORDER — ATORVASTATIN CALCIUM 80 MG PO TABS
80.0000 mg | ORAL_TABLET | Freq: Every day | ORAL | Status: DC
  Administered 2019-05-05 – 2019-05-10 (×6): 80 mg via ORAL
  Filled 2019-05-05 (×6): qty 1

## 2019-05-05 MED ORDER — FENTANYL CITRATE (PF) 100 MCG/2ML IJ SOLN
INTRAMUSCULAR | Status: AC
  Filled 2019-05-05: qty 2

## 2019-05-05 MED ORDER — SODIUM CHLORIDE 0.9 % IV SOLN: 250.0000 mL | INTRAVENOUS | Status: DC | PRN

## 2019-05-05 MED ORDER — SODIUM CHLORIDE 0.9 % IV SOLN
INTRAVENOUS | Status: DC
  Filled 2019-05-05: qty 30

## 2019-05-05 MED ORDER — DEXMEDETOMIDINE HCL IN NACL 400 MCG/100ML IV SOLN
0.1000 ug/kg/h | INTRAVENOUS | Status: AC
  Administered 2019-05-06: .5 ug/kg/h via INTRAVENOUS
  Filled 2019-05-05 (×2): qty 100

## 2019-05-05 MED ORDER — SODIUM CHLORIDE 0.9% FLUSH: 3.0000 mL | INTRAVENOUS | Status: DC | PRN

## 2019-05-05 MED ORDER — SODIUM CHLORIDE 0.9% FLUSH: 3.0000 mL | Freq: Two times a day (BID) | INTRAVENOUS | Status: DC

## 2019-05-05 MED ORDER — DOPAMINE-DEXTROSE 3.2-5 MG/ML-% IV SOLN
0.0000 ug/kg/min | INTRAVENOUS | Status: DC
  Filled 2019-05-05: qty 250

## 2019-05-05 MED ORDER — TRANEXAMIC ACID (OHS) BOLUS VIA INFUSION
15.0000 mg/kg | INTRAVENOUS | Status: AC
  Administered 2019-05-06: 1393.5 mg via INTRAVENOUS
  Filled 2019-05-05: qty 1394

## 2019-05-05 MED ORDER — LIDOCAINE HCL (PF) 1 % IJ SOLN
INTRAMUSCULAR | Status: DC | PRN
  Administered 2019-05-05: 2 mL

## 2019-05-05 MED ORDER — SODIUM CHLORIDE 0.9 % IV SOLN
750.0000 mg | INTRAVENOUS | Status: AC
  Administered 2019-05-06: 750 mg via INTRAVENOUS
  Filled 2019-05-05: qty 750

## 2019-05-05 MED ORDER — VERAPAMIL HCL 2.5 MG/ML IV SOLN
INTRAVENOUS | Status: DC | PRN
  Administered 2019-05-05: 8 mL via INTRA_ARTERIAL

## 2019-05-05 MED ORDER — VERAPAMIL HCL 2.5 MG/ML IV SOLN
INTRAVENOUS | Status: AC
  Filled 2019-05-05: qty 2

## 2019-05-05 MED ORDER — LIDOCAINE HCL (PF) 1 % IJ SOLN
INTRAMUSCULAR | Status: AC
  Filled 2019-05-05: qty 30

## 2019-05-05 MED ORDER — POTASSIUM CHLORIDE CRYS ER 20 MEQ PO TBCR
40.0000 meq | EXTENDED_RELEASE_TABLET | Freq: Once | ORAL | Status: AC
  Administered 2019-05-05: 40 meq via ORAL
  Filled 2019-05-05: qty 2

## 2019-05-05 MED ORDER — FENTANYL CITRATE (PF) 100 MCG/2ML IJ SOLN
INTRAMUSCULAR | Status: DC | PRN
  Administered 2019-05-05: 50 ug via INTRAVENOUS

## 2019-05-05 MED ORDER — EPINEPHRINE PF 1 MG/ML IJ SOLN
0.0000 ug/min | INTRAVENOUS | Status: DC
  Filled 2019-05-05: qty 4

## 2019-05-05 MED ORDER — TRANEXAMIC ACID 1000 MG/10ML IV SOLN
1.5000 mg/kg/h | INTRAVENOUS | Status: AC
  Administered 2019-05-06: 1.5 mg/kg/h via INTRAVENOUS
  Filled 2019-05-05: qty 25

## 2019-05-05 MED ORDER — SODIUM CHLORIDE 0.9 % IV SOLN
1.5000 g | INTRAVENOUS | Status: AC
  Administered 2019-05-06: 1.5 g via INTRAVENOUS
  Filled 2019-05-05: qty 1.5

## 2019-05-05 MED ORDER — MIDAZOLAM HCL 2 MG/2ML IJ SOLN
INTRAMUSCULAR | Status: DC | PRN
  Administered 2019-05-05: 1 mg via INTRAVENOUS

## 2019-05-05 MED ORDER — PHENYLEPHRINE HCL-NACL 20-0.9 MG/250ML-% IV SOLN
30.0000 ug/min | INTRAVENOUS | Status: AC
  Administered 2019-05-06: 20 ug/min via INTRAVENOUS
  Administered 2019-05-06: 25 ug/min via INTRAVENOUS
  Filled 2019-05-05 (×2): qty 250

## 2019-05-05 MED ORDER — BISACODYL 5 MG PO TBEC
5.0000 mg | DELAYED_RELEASE_TABLET | Freq: Once | ORAL | Status: AC
  Administered 2019-05-05: 5 mg via ORAL
  Filled 2019-05-05: qty 1

## 2019-05-05 MED ORDER — INSULIN REGULAR(HUMAN) IN NACL 100-0.9 UT/100ML-% IV SOLN
INTRAVENOUS | Status: AC
  Administered 2019-05-06: 1 [IU]/h via INTRAVENOUS
  Filled 2019-05-05: qty 100

## 2019-05-05 MED ORDER — POTASSIUM CHLORIDE 2 MEQ/ML IV SOLN
80.0000 meq | INTRAVENOUS | Status: DC
  Filled 2019-05-05: qty 40

## 2019-05-05 MED ORDER — NITROGLYCERIN IN D5W 200-5 MCG/ML-% IV SOLN
2.0000 ug/min | INTRAVENOUS | Status: DC
  Filled 2019-05-05: qty 250

## 2019-05-05 MED ORDER — VANCOMYCIN HCL 10 G IV SOLR
1500.0000 mg | INTRAVENOUS | Status: AC
  Administered 2019-05-06: 1500 mg via INTRAVENOUS
  Filled 2019-05-05: qty 1500

## 2019-05-05 MED ORDER — SODIUM CHLORIDE 0.9 % IV SOLN: INTRAVENOUS | Status: AC

## 2019-05-05 MED ORDER — HEPARIN (PORCINE) IN NACL 1000-0.9 UT/500ML-% IV SOLN
INTRAVENOUS | Status: AC
  Filled 2019-05-05: qty 1000

## 2019-05-05 MED ORDER — MAGNESIUM SULFATE 50 % IJ SOLN
40.0000 meq | INTRAMUSCULAR | Status: DC
  Filled 2019-05-05: qty 9.85

## 2019-05-05 MED ORDER — SODIUM CHLORIDE 0.9 % IV SOLN
INTRAVENOUS | Status: DC
  Administered 2019-05-05: 14:00:00 via INTRAVENOUS

## 2019-05-05 MED ORDER — HEPARIN (PORCINE) 25000 UT/250ML-% IV SOLN
1300.0000 [IU]/h | INTRAVENOUS | Status: DC
  Administered 2019-05-05: 1300 [IU]/h via INTRAVENOUS
  Filled 2019-05-05: qty 250

## 2019-05-05 MED ORDER — CHLORHEXIDINE GLUCONATE CLOTH 2 % EX PADS
6.0000 | MEDICATED_PAD | Freq: Once | CUTANEOUS | Status: AC
  Administered 2019-05-05: 6 via TOPICAL

## 2019-05-05 MED ORDER — TRAZODONE HCL 50 MG PO TABS
25.0000 mg | ORAL_TABLET | Freq: Every evening | ORAL | Status: DC | PRN
  Administered 2019-05-05: 25 mg via ORAL
  Filled 2019-05-05: qty 1

## 2019-05-05 MED ORDER — TEMAZEPAM 7.5 MG PO CAPS: 15.0000 mg | ORAL_CAPSULE | Freq: Once | ORAL | Status: DC | PRN

## 2019-05-05 MED ORDER — MIDAZOLAM HCL 2 MG/2ML IJ SOLN
INTRAMUSCULAR | Status: AC
  Filled 2019-05-05: qty 2

## 2019-05-05 MED ORDER — NOREPINEPHRINE 4 MG/250ML-% IV SOLN
0.0000 ug/min | INTRAVENOUS | Status: DC
  Filled 2019-05-05 (×2): qty 250

## 2019-05-05 MED ORDER — MILRINONE LACTATE IN DEXTROSE 20-5 MG/100ML-% IV SOLN
0.3000 ug/kg/min | INTRAVENOUS | Status: DC
  Filled 2019-05-05: qty 100

## 2019-05-05 MED ORDER — PLASMA-LYTE 148 IV SOLN
INTRAVENOUS | Status: DC
  Filled 2019-05-05: qty 2.5

## 2019-05-05 MED ORDER — IOHEXOL 350 MG/ML SOLN
INTRAVENOUS | Status: DC | PRN
  Administered 2019-05-05: 55 mL via INTRA_ARTERIAL

## 2019-05-05 MED ORDER — CHLORHEXIDINE GLUCONATE CLOTH 2 % EX PADS
6.0000 | MEDICATED_PAD | Freq: Once | CUTANEOUS | Status: AC
  Administered 2019-05-06: 6 via TOPICAL

## 2019-05-05 MED ORDER — HEPARIN SODIUM (PORCINE) 1000 UNIT/ML IJ SOLN
INTRAMUSCULAR | Status: DC | PRN
  Administered 2019-05-05: 4500 [IU] via INTRAVENOUS

## 2019-05-05 SURGICAL SUPPLY — 13 items
CATH INFINITI 5FR ANG PIGTAIL (CATHETERS) ×2 IMPLANT
CATH INFINITI 5FR JK (CATHETERS) ×2 IMPLANT
CATH INFINITI JR4 5F (CATHETERS) ×2 IMPLANT
COVER DOME SNAP 22 D (MISCELLANEOUS) ×2 IMPLANT
DEVICE RAD COMP TR BAND LRG (VASCULAR PRODUCTS) ×2 IMPLANT
GLIDESHEATH SLEND SS 6F .021 (SHEATH) ×2 IMPLANT
GUIDEWIRE INQWIRE 1.5J.035X260 (WIRE) ×1 IMPLANT
INQWIRE 1.5J .035X260CM (WIRE) ×2
KIT HEART LEFT (KITS) ×2 IMPLANT
PACK CARDIAC CATHETERIZATION (CUSTOM PROCEDURE TRAY) ×2 IMPLANT
SYR MEDRAD MARK 7 150ML (SYRINGE) ×2 IMPLANT
TRANSDUCER W/STOPCOCK (MISCELLANEOUS) ×2 IMPLANT
TUBING CIL FLEX 10 FLL-RA (TUBING) ×2 IMPLANT

## 2019-05-05 NOTE — Progress Notes (Signed)
Pre CABG Dopplers completed, Results in Chart review CV Proc. [employer], RVS 05/05/2019 6:26 PM

## 2019-05-05 NOTE — Consult Note (Signed)
[address]       [place] [address]             [phone]        Marcella Brassard [facility] Medical Record #[id number] Date of Birth: 1971/11/07  Referring: No ref. provider found Primary Care: Deatra James, MD Primary Cardiologist:No primary care provider on file.  Chief Complaint:    Chief Complaint  Patient presents with   Chest Pain  Non-ST   History of Present Illness: The patient is a 48 year old male who presented to the emergency department yesterday where he presented with chest pain.  The patient has had intermittent episodes of chest pain that would radiate down both arms for the past 3 weeks.  The pain is described as pressure and fairly classically as if somebody is sitting on his chest.  He initially managed with antacids as he thought it may be related to indigestion and would occur after eating.  This resulted in no significant improvement.  The patient also did try to walk after eating but continued to have some pain.  The pain would stop at approximately 10 minutes after he would rest but would quickly resume when he started to walk again.  He notes that there is been increasing frequency and intensity in these episodes over the past 3 weeks.  Yesterday he had an episode while driving with a sudden onset of chest pain with radiation down his left arm.  The left arm developed numbness and also felt very weak.  He also noted some tingling in his cheeks with pain.  The patient does have a history of hypertension and hyperlipidemia but no previous history of known cardiac disease.  He has noted some increasing shortness of breath when he and his wife walk in the evening.  He does den, nausea, vomiting or syncope.  Due to the intensity of this episode he did present to the emergency department for further evaluation.  He was noted to be hypertensive with stable vital signs and EKG showed some inferior Q waves with ST depression in the anterior leads.  His initial  troponin was negative.   Repeat troponins were mildly elevated with peak of 0.10.  Cardiology consultation was obtained and was felt that he should undergo diagnostic cardiac catheterization which was done on today's date.  Please see findings below.  He is noted to have severe proximal LAD lesion as well as severe three-vessel disease including a chronically occluded mid left circumflex.  His left ventricular systolic function is normal.  His LV EDP pressure is normal.  Ejection fraction is 55 to 65% by visual estimate.  Cardiology has recommended urgent CABG given the severity and complexity of the disease and we are asked to see in cardiothoracic surgical consultation.  His hemoglobin A1c is noted to be normal at 5.2.  His mean plasma glucose is 102.54.  His serum TSH is in the normal range.  Renal function is in the normal range with a GFR greater than 60.  He is not anemic.  He does have a somewhat poor triglyceride to HDL ratio and LDL C is 101.  We do not have a particle breakdown.    Current Activity/ Functional Status: Patient is independent with mobility/ambulation, transfers, ADL's, IADL's.   Zubrod Score: At the time of surgery this patients most appropriate activity status/level should be described as: []     0    Normal activity, no symptoms [x]   1    Restricted in physical strenuous activity but ambulatory, able to do out light work     2    Ambulatory and capable of self care, unable to do work activities, up and about                 more than 50%  Of the time                                3    Only limited self care, in bed greater than 50% of waking hours     4    Completely disabled, no self care, confined to bed or chair     5    Moribund  Past Medical History:  Diagnosis Date   High cholesterol    Hypertension     History reviewed. No pertinent surgical history.  Social History   Tobacco Use  Smoking Status Never Smoker  Smokeless Tobacco Never Used      Social History   Substance and Sexual Activity  Alcohol Use Never   Frequency: Never     Allergies  Allergen Reactions   Azithromycin Other (See Comments)    Spikes BP    Current Facility-Administered Medications  Medication Dose Route Frequency Provider Last Rate Last Dose   0.9 %  sodium chloride infusion   Intravenous Continuous Arida, Muhammad A, MD       0.9 %  sodium chloride infusion  250 mL Intravenous PRN Iran Ouch, MD       acetaminophen (TYLENOL) tablet 650 mg  650 mg Oral Q4H PRN Iran Ouch, MD       amLODipine (NORVASC) tablet 10 mg  10 mg Oral Daily Lorine Bears A, MD   10 mg at 05/05/19 1007   aspirin EC tablet 81 mg  81 mg Oral Daily Lorine Bears A, MD   81 mg at 05/05/19 1007   atorvastatin (LIPITOR) tablet 80 mg  80 mg Oral q1800 Lorine Bears A, MD       hydrALAZINE (APRESOLINE) injection 5 mg  5 mg Intravenous Q4H PRN Iran Ouch, MD       losartan (COZAAR) tablet 100 mg  100 mg Oral Daily Lorine Bears A, MD   100 mg at 05/05/19 1007   And   hydrochlorothiazide (MICROZIDE) capsule 12.5 mg  12.5 mg Oral Daily Lorine Bears A, MD   12.5 mg at 05/05/19 1007   insulin aspart (novoLOG) injection 0-15 Units  0-15 Units Subcutaneous TID WC Arida, Muhammad A, MD       morphine 2 MG/ML injection 2 mg  2 mg Intravenous Q2H PRN Iran Ouch, MD       ondansetron (ZOFRAN) injection 4 mg  4 mg Intravenous Q6H PRN Iran Ouch, MD       potassium chloride SA (K-DUR) CR tablet 40 mEq  40 mEq Oral Once Lorine Bears A, MD       sodium chloride flush (NS) 0.9 % injection 3 mL  3 mL Intravenous Q12H Arida, Muhammad A, MD       sodium chloride flush (NS) 0.9 % injection 3 mL  3 mL Intravenous PRN Iran Ouch, MD        Medications Prior to Admission  Medication Sig Dispense Refill Last Dose   amLODipine (NORVASC) 10 MG tablet Take 10 mg by mouth daily.    05/04/2019 at  Unknown time   atenolol (TENORMIN) 100  MG tablet Take 100 mg by mouth.    05/04/2019 at 0930   atorvastatin (LIPITOR) 10 MG tablet Take 10 mg by mouth every morning.    05/04/2019 at Unknown time   cyclobenzaprine (FLEXERIL) 10 MG tablet Take 10 mg by mouth 3 (three) times daily as needed for muscle spasms.    unk   fluticasone (FLONASE) 50 MCG/ACT nasal spray Place 1 spray into both nostrils daily.   05/03/2019 at Unknown time   losartan-hydrochlorothiazide (HYZAAR) 100-12.5 MG tablet Take 1 tablet by mouth daily.    05/04/2019 at Unknown time   magnesium oxide (MAG-OX) 400 MG tablet Take 400 mg by mouth daily.   05/04/2019 at Unknown time   Omega-3 Fatty Acids (OMEGA-3 FISH OIL CONCENTRATE) 1000 MG CPDR Take 1,000 mg by mouth daily.   05/04/2019 at Unknown time   testosterone (ANDROGEL) 50 MG/5GM (1%) GEL Place 5 g onto the skin daily.    Past Month at Unknown time    Family History  Problem Relation Age of Onset   Other Mother 51       Shy-Drager Syndrome   Hypertension Father    Valvular heart disease Father      Review of Systems:   Review of Systems  Constitutional: Negative for chills, diaphoresis, fever, malaise/fatigue and weight loss.  HENT: Positive for tinnitus. Negative for congestion, ear discharge, ear pain, hearing loss, nosebleeds, sinus pain and sore throat.   Eyes: Negative for blurred vision, double vision, photophobia, pain, discharge and redness.  Respiratory: Positive for shortness of breath. Negative for cough, hemoptysis, sputum production, wheezing and stridor.   Cardiovascular: Positive for chest pain. Negative for palpitations, orthopnea, claudication, leg swelling and PND.       + snores  Gastrointestinal: Positive for heartburn. Negative for abdominal pain, blood in stool, constipation, diarrhea, melena, nausea and vomiting.  Genitourinary: Positive for urgency. Negative for dysuria, flank pain, frequency and hematuria.  Musculoskeletal: Positive for back pain and myalgias. Negative for falls,  joint pain and neck pain.       Left hamstring discomfort  Skin: Negative for itching and rash.  Neurological: Negative for dizziness, tingling, tremors, sensory change, speech change, focal weakness, seizures, loss of consciousness, weakness and headaches.  Endo/Heme/Allergies: Positive for environmental allergies and polydipsia. Does not bruise/bleed easily.  Psychiatric/Behavioral: Negative for depression, hallucinations, memory loss, substance abuse and suicidal ideas. The patient is not nervous/anxious and does not have insomnia.       Physical Exam: BP 135/88    Pulse (!) 0    Temp (!) 97.5 F (36.4 C) (Oral)    Resp (!) 0    Ht  (1.778 m)    Wt 92.9 kg    SpO2 (!) 0%    BMI 29.40 kg/m   Physical Exam  Constitutional: He appears healthy. No distress.  HENT:  Nose: Nose normal. No nasal discharge.  Mouth/Throat: No dental caries. Oropharynx is clear. Pharynx is normal.  Eyes: Pupils are equal, round, and reactive to light. Conjunctivae are normal.  Neck: Normal range of motion and thyroid normal. Neck supple. No JVD present. No neck adenopathy. No thyromegaly present.  Cardiovascular: Normal rate, regular rhythm, S1 normal, S2 normal, normal heart sounds, intact distal pulses and normal pulses. Exam reveals no gallop.  No murmur heard. Pulmonary/Chest: Effort normal and breath sounds normal. No stridor. He has no wheezes. He has no rales. He exhibits no tenderness.  Abdominal: Soft.  Bowel sounds are normal. He exhibits no distension and no mass. There is no abdominal tenderness.  Musculoskeletal: Normal range of motion.  Neurological: He is alert and oriented to person, place, and time. He has normal motor skills.  Skin: Skin is warm and dry. No rash noted. No cyanosis. No jaundice or pallor. Nails show no clubbing.    Diagnostic Studies & Laboratory data:     Recent Radiology Findings:   Dg Chest Port 1 View  Result Date: 05/04/2019 CLINICAL DATA:  Chest pain. EXAM:  PORTABLE CHEST 1 VIEW COMPARISON:  None. FINDINGS: The heart size and mediastinal contours are within normal limits. Both lungs are clear. The visualized skeletal structures are unremarkable. IMPRESSION: No active disease. Electronically Signed   By: Obie Dredge M.D.   On: 05/04/2019 10:44     I have independently reviewed the above radiologic studies and discussed with the patient   Recent Lab Findings: Lab Results  Component Value Date   WBC 7.5 05/04/2019   HGB 15.5 05/04/2019   HCT 47.2 05/04/2019   PLT 268 05/04/2019   GLUCOSE 143 (H) 05/04/2019   CHOL 156 05/05/2019   TRIG 107 05/05/2019   HDL 34 (L) 05/05/2019   LDLCALC 101 (H) 05/05/2019   NA 137 05/04/2019   K 3.3 (L) 05/04/2019   CL 103 05/04/2019   CREATININE 1.03 05/04/2019   BUN 13 05/04/2019   CO2 20 (L) 05/04/2019   TSH 1.804 05/04/2019   HGBA1C 5.2 05/04/2019     Prox LAD lesion is 99% stenosed.  Ost 1st Diag lesion is 80% stenosed.  Mid LAD to Dist LAD lesion is 30% stenosed.  Prox Cx to Mid Cx lesion is 100% stenosed.  Prox RCA to Dist RCA lesion is 80% stenosed.  The left ventricular systolic function is normal.  LV end diastolic pressure is normal.  The left ventricular ejection fraction is 55-65% by visual estimate.   1.  Severe three-vessel coronary artery disease with chronically occluded mid left circumflex, complex bifurcation disease affecting the proximal LAD/first diagonal and diffuse disease affecting the whole body of the right coronary artery. 2.  Normal LV systolic function and left ventricular end-diastolic pressure.  Recommendations: I recommend urgent CABG given complexity of disease.   Indications   Non-ST elevation (NSTEMI) myocardial infarction (HCC) [I21.4 (ICD-10-CM)]  Procedural Details   Technical Details Procedural Details: The right wrist was prepped, draped, and anesthetized with 1% lidocaine. Using the modified Seldinger technique, a 5 French sheath was  introduced into the right radial artery. 3 mg of verapamil was administered through the sheath, weight-based unfractionated heparin was administered intravenously. A Jackie catheter was used for selective coronary angiography. A pigtail catheter was used for left ventriculography.  A JR4 catheter was needed to engage the right coronary artery.  Catheter exchanges were performed over an exchange length guidewire. There were no immediate procedural complications. A TR band was used for radial hemostasis at the completion of the procedure.  The patient was transferred to the post catheterization recovery area for further monitoring.  Estimated blood loss <50 mL.   During this procedure medications were administered to achieve and maintain moderate conscious sedation while the patient's heart rate, blood pressure, and oxygen saturation were continuously monitored and I was present face-to-face 100% of this time.  Medications  (Filter: Administrations occurring from 05/05/19 1351 to 05/05/19 1438)  Medication Rate/Dose/Volume Action  Date Time   midazolam (VERSED) injection (mg) 1 mg Given 05/05/19 1402   Total  dose as of 05/05/19 1510        1 mg        fentaNYL (SUBLIMAZE) injection (mcg) 50 mcg Given 05/05/19 1402   Total dose as of 05/05/19 1510        50 mcg        lidocaine (PF) (XYLOCAINE) 1 % injection (mL) 2 mL Given 05/05/19 1408   Total dose as of 05/05/19 1510        2 mL        Radial Cocktail/Verapamil only (mL) 8 mL Given 05/05/19 1409   Total dose as of 05/05/19 1510        8 mL        Heparin (Porcine) in NaCl 1000-0.9 UT/500ML-% SOLN (mL) 500 mL Given 05/05/19 1409   Total dose as of 05/05/19 1510        500 mL        Heparin (Porcine) in NaCl 1000-0.9 UT/500ML-% SOLN (mL) 500 mL Given 05/05/19 1409   Total dose as of 05/05/19 1510        500 mL        heparin injection (Units) 4,500 Units Given 05/05/19 1410   Total dose as of 05/05/19 1510        4,500 Units        iohexol  (OMNIPAQUE) 350 MG/ML injection (mL) 55 mL Given 05/05/19 1431   Total dose as of 05/05/19 1510        55 mL        0.9 % sodium chloride infusion (mL/hr) 50 mL/hr New Bag/Given 05/05/19 1405   Dosing weight:  92.9 kg        Total dose as of 05/05/19 1510        30.96 mL        Sedation Time   Sedation Time Physician-1: 27 minutes 28 seconds  Coronary Findings   Diagnostic  Dominance: Right  Left Main  Vessel is angiographically normal.  Left Anterior Descending  Prox LAD lesion 99% stenosed  Prox LAD lesion is 99% stenosed.  Mid LAD to Dist LAD lesion 30% stenosed  Mid LAD to Dist LAD lesion is 30% stenosed.  First Diagonal Branch  Ost 1st Diag lesion 80% stenosed  Ost 1st Diag lesion is 80% stenosed.  Left Circumflex  Prox Cx to Mid Cx lesion 100% stenosed  Prox Cx to Mid Cx lesion is 100% stenosed.  Right Coronary Artery  Prox RCA to Dist RCA lesion 80% stenosed  Prox RCA to Dist RCA lesion is 80% stenosed.  Intervention   No interventions have been documented.  Wall Motion   Resting               Left Heart   Left Ventricle The left ventricular size is normal. The left ventricular systolic function is normal. LV end diastolic pressure is normal. The left ventricular ejection fraction is 55-65% by visual estimate. No regional wall motion abnormalities.  Coronary Diagrams   Diagnostic  Dominance: Right      Assessment / Plan: Severe three-vessel coronary artery disease with accelerating unstable angina/ACS, small Troponin elevation. HTN Hyperlipidemia Query sleep apnea  Plan : CABG   I  spent 60 minutes counseling the patient face to face.   Rowe ClackWayne E Christie Viscomi, PA-C 05/05/2019 3:03 PM

## 2019-05-05 NOTE — Progress Notes (Signed)
  Echocardiogram 2D Echocardiogram has been performed.  Edward Lawrence 05/05/2019, 5:23 PM

## 2019-05-05 NOTE — Interval H&P Note (Signed)
Cath Lab Visit (complete for each Cath Lab visit)  Clinical Evaluation Leading to the Procedure:   ACS: Yes.     Non-ACS:  n/a    History and Physical Interval Note:  05/05/2019 2:01 PM  Edward Lawrence  has presented today for surgery, with the diagnosis of angina.  The various methods of treatment have been discussed with the patient and family. After consideration of risks, benefits and other options for treatment, the patient has consented to  Procedure(s): LEFT HEART CATH AND CORONARY ANGIOGRAPHY (N/A) as a surgical intervention.  The patient's history has been reviewed, patient examined, no change in status, stable for surgery.  I have reviewed the patient's chart and labs.  Questions were answered to the patient's satisfaction.     Lorine Bears

## 2019-05-05 NOTE — Consult Note (Addendum)
Cardiology Consult    Patient ID: Edward Lawrence MRN: [id number], DOB/AGE: 05/28/1971   Admit date: 05/04/2019 Date of Consult: 05/05/2019  Primary Physician: Deatra James, MD Primary Cardiologist: New to [facility] (Dr. Allyson Sabal) Requesting Provider: Dr. Benjamine Mola  Patient Profile    Edward Lawrence is a 48 y.o. male with a history of hypertension and hyperlipidemia but no known cardiac disease, who is being seen today for the evaluation of chest at the request of Dr. Benjamine Mola.  History of Present Illness    Edward Lawrence is a 48 year old male with a history of hypertension and hyperlipidemia but no known cardiac disease. He has never seen a Cardiologist or had any type of cardiac work-up.  Patient presented to the ED yesterday for evaluation of chest pain. Patient reports intermittent chest pain that would radiate down both arms for the past 3 weeks. He describes the pain as a pressure and states it feels like someone is sitting on his chest. Patient initially thought the pain was just indigestion because it would occur after eating a meal and then going on a walk. Patient tried two over-the-counter antacids with no improvement. Patient also tried walking before eating and still had chest pain. Pain would resolve after stopping to rest for 10 minutes but would then quickly return when he started walking again. Patient feels like the frequency and intensity off the pain has increased over the last 3 weeks. Yesterday, patient was driving and had sudden onset of chest pain with radiation down his left arm. Patient states his left arm when numb and felt very week. Patient also reports tingling in his cheeks with pain. No associated shortness of breath, nausea, vomiting, or syncope. Patient has never had any chest pain prior to 3 weeks ago. Due intense episode of pain yesterday while driving, patient decided to come to the ED for further evaluation. No recent fevers, chills, or body aches. Patient reports some nasal congestion due  to allergies but denies any cough or known exposure to the coronavirus.   In the ED, patient hypertensive with BP of 162/101 but vitals stable. EKG reportedly showed some inferior Q waves and ST depression in the anterior leads (unable to personally review this remotely). Initial troponin negative. Chest x-ray showed no acute findings. WBC 7.5, Hgb 15.5, Plts 268. Na 137, K 3.3, Glucose 143, SCr 1.03. COVID-19 testing negative.  At the time of this evaluation, patient denies any additional episodes of chest pain since yesterday.  Patient denies any history of tobacco use. He reports rare alcohol use and denies any recreational drug use. Patient's father has a history of hypertension and a heart valve replace but he denies any other known family history of heart disease.   Past Medical History   Past Medical History:  Diagnosis Date   High cholesterol    Hypertension     History reviewed. No pertinent surgical history.   Allergies  Allergies  Allergen Reactions   Azithromycin Other (See Comments)    Spikes BP    Inpatient Medications     amLODipine  10 mg Oral Daily   aspirin EC  81 mg Oral Daily   atenolol  100 mg Oral Daily   atorvastatin  10 mg Oral q1800   enoxaparin (LOVENOX) injection  40 mg Subcutaneous Q24H   losartan  100 mg Oral Daily   And   hydrochlorothiazide  12.5 mg Oral Daily   insulin aspart  0-15 Units Subcutaneous TID WC    Family  History    Family History  Problem Relation Age of Onset   Other Mother 17       Shy-Drager Syndrome   Hypertension Father    Valvular heart disease Father    He indicated that his mother is deceased. He indicated that his father is alive.   Social History    Social History   Socioeconomic History   Marital status: Married    Spouse name: Not on file   Number of children: Not on file   Years of education: Not on file   Highest education level: Not on file  Occupational History   Occupation:  Risk analyst  Social Needs   Financial resource strain: Not on file   Food insecurity:    Worry: Not on file    Inability: Not on file   Transportation needs:    Medical: Not on file    Non-medical: Not on file  Tobacco Use   Smoking status: Never Smoker   Smokeless tobacco: Never Used  Substance and Sexual Activity   Alcohol use: Never    Frequency: Never   Drug use: Never   Sexual activity: Not on file  Lifestyle   Physical activity:    Days per week: Not on file    Minutes per session: Not on file   Stress: Not on file  Relationships   Social connections:    Talks on phone: Not on file    Gets together: Not on file    Attends religious service: Not on file    Active member of club or organization: Not on file    Attends meetings of clubs or organizations: Not on file    Relationship status: Not on file   Intimate partner violence:    Fear of current or ex partner: Not on file    Emotionally abused: Not on file    Physically abused: Not on file    Forced sexual activity: Not on file  Other Topics Concern   Not on file  Social History Narrative   Not on file     Review of Systems    Review of Systems  Constitutional: Negative for chills and fever.  HENT: Positive for congestion. Negative for sore throat.   Respiratory: Negative for cough, hemoptysis and sputum production.   Cardiovascular: Positive for chest pain. Negative for palpitations.  Gastrointestinal: Negative for constipation, nausea and vomiting.  Genitourinary: Negative for hematuria.  Musculoskeletal: Negative for myalgias.  Neurological: Positive for tingling. Negative for loss of consciousness. Dizziness: lightheadedness.  Endo/Heme/Allergies: Does not bruise/bleed easily.  Psychiatric/Behavioral: Negative for substance abuse.    Physical Exam    Blood pressure (!) 141/95, pulse (!) 54, temperature (!) 97.5 F (36.4 C), temperature source Oral, resp. rate 20, height   (1.778 m), weight 92.9 kg, SpO2 99 %.   Physical Exam per MD:  General: 48 y.o. male resting comfortably in no acute distress. Pleasant and cooperative. HEENT: Normal  Neck: Supple. No carotid bruits or JVD appreciated. Lungs: No increased work of breathing. Clear to auscultation bilaterally. No wheezes, rhonchi, or rales. Heart: RRR. Distinct S1 and S2. No murmurs, gallops, or rubs.  Abdomen: Soft, non-distended, and non-tender to palpation. Bowel sounds present in all 4 quadrants.   Extremities: No clubbing, cyanosis or edema. Radial, posterior tibial, and distal pedal pulses 2+ and equal bilaterally. Skin: Warm and dry. Neuro: Alert and oriented x3. No focal deficits. Moves all extremities spontaneously. Psych: Normal affect.  Labs  Troponin (Point of Care Test) No results for input(s): TROPIPOC in the last 72 hours. Recent Labs    05/04/19 1015 05/04/19 1528 05/04/19 2105 05/05/19 0325  TROPONINI <0.03 0.10* 0.09* 0.06*   Lab Results  Component Value Date   WBC 7.5 05/04/2019   HGB 15.5 05/04/2019   HCT 47.2 05/04/2019   MCV 79.1 (L) 05/04/2019   PLT 268 05/04/2019    Recent Labs  Lab 05/04/19 1015  NA 137  K 3.3*  CL 103  CO2 20*  BUN 13  CREATININE 1.03  CALCIUM 9.5  GLUCOSE 143*   Lab Results  Component Value Date   CHOL 156 05/05/2019   HDL 34 (L) 05/05/2019   LDLCALC 101 (H) 05/05/2019   TRIG 107 05/05/2019   No results found for: [facility]   Radiology Studies    Dg Chest Port 1 View  Result Date: 05/04/2019 CLINICAL DATA:  Chest pain. EXAM: PORTABLE CHEST 1 VIEW COMPARISON:  None. FINDINGS: The heart size and mediastinal contours are within normal limits. Both lungs are clear. The visualized skeletal structures are unremarkable. IMPRESSION: No active disease. Electronically Signed   By: Obie DredgeWilliam T Derry M.D.   On: 05/04/2019 10:44    EKG     EKG: EKG was personally reviewed and demonstrates: Normal sinus rhythm at 79 without ST or T wave  changes Telemetry: Telemetry was personally reviewed and demonstrates: Sinus rhythm  Cardiac Imaging    None.  Assessment & Plan    Chest Pain Concerning for Unstable Angina - Patient presents with worsening exertional chest pain over the past 3 weeks with an episode at rest yesterday. He has a history of hypertension and hyperlipidemia but no known cardiac disease.  - EKG showed normal sinus rhythm with small but nondiagnostic inferior Q waves - Initial troponin negative. Repeat troponin minimally elevated and flat at 0.1 >> 0.09 >> 0.06. - LDL 101. - Hemoglobin A1c 5.2. - Patient currently chest pain free.  - Continue aspirin and statin. Will hold off on adding beta blocker due to bradycardia. - Presentation concerning for unstable angina/ACS. Suspect patient may need cardiac catheterization. Will discuss with MD.  Hypertension - BP elevated at 141/95.  - Continue home Amlodipine 10mg  daily and home Losartan-HCTZ 100-12.5mg  daily.  Hyperlipidemia - Lipid panel this admission: Cholesterol 156, Triglycerides 107, HDL 34, LDL 101. - Currently on Lipitor 10mg  daily at home. Will increase to 40mg  daily for high intensity status.    Signed, Corrin Parkerallie E Goodrich, PA-C 05/05/2019, 12:04 PM Pager: [phone]  Agree with note by Marjie Skiffallie Goodrich, PA-C  48 year old married Caucasian male with positive risk factors including hypertension hyperlipidemia describes 3 weeks of accelerated chest pain mostly exertional but also at rest.  The pain was worse yesterday going into both his arms and his jaw.  He is currently pain-free.  His exam is benign.  EKG shows no acute changes.  Small nondiagnostic inferior Q waves.  Enzymes are low and flat.  His story is good for unstable angina.  I favor diagnostic coronary angiography. The patient understands that risks included but are not limited to stroke (1 in 1000), death (1 in 1000), kidney failure [usually temporary] (1 in 500), bleeding (1 in 200),  allergic reaction [possibly serious] (1 in 200). The patient understands and agrees to proceed   Runell GessJonathan J. Shanta Hartner, M.D., FACP, [facility]FACC, Kath[facility]ne ErikssonFAHA, FSCAI [facility] [address] [place], [place]  [address]  [phone] 05/05/2019 1:09 PM  For questions or  updates, please contact   Please consult [email or web address] for contact info under Cardiology/STEMI.  Addendum: Mr. Sherlie Lawrence is back from Cath Lab.  He has three-vessel disease and preserved LV function with a total mid AV groove circumflex, 99% proximal LAD stenosis and diffusely diseased RCA.  He will need CABG tomorrow.  Dr. Laneta Simmers is evaluating.  He will be started on IV heparin.  Runell Gess, M.D., FACP, [facility], [facility] [address] [place], [place]  [address]  [phone] 05/05/2019 3:36 PM

## 2019-05-05 NOTE — Anesthesia Preprocedure Evaluation (Addendum)
Anesthesia Evaluation  Patient identified by MRN, date of birth, ID band Patient awake    Reviewed: Allergy & Precautions, H&P , NPO status , Patient's Chart, lab work & pertinent test results  Airway Mallampati: II  TM Distance: >3 FB Neck ROM: Full    Dental no notable dental hx. (+) Teeth Intact, Dental Advisory Given   Pulmonary neg pulmonary ROS,    Pulmonary exam normal breath sounds clear to auscultation       Cardiovascular Exercise Tolerance: Good hypertension, Pt. on medications and Pt. on home beta blockers + angina + CAD   Rhythm:Regular Rate:Normal     Neuro/Psych negative neurological ROS  negative psych ROS   GI/Hepatic negative GI ROS, Neg liver ROS,   Endo/Other  negative endocrine ROS  Renal/GU negative Renal ROS  negative genitourinary   Musculoskeletal   Abdominal   Peds  Hematology negative hematology ROS (+)   Anesthesia Other Findings   Reproductive/Obstetrics negative OB ROS                            Anesthesia Physical Anesthesia Plan  ASA: IV  Anesthesia Plan: General   Post-op Pain Management:    Induction: Intravenous  PONV Risk Score and Plan: 2 and Treatment may vary due to age or medical condition and Midazolam  Airway Management Planned: Oral ETT  Additional Equipment: Arterial line, CVP, PA Cath, TEE and Ultrasound Guidance Line Placement  Intra-op Plan:   Post-operative Plan: Post-operative intubation/ventilation  Informed Consent: I have reviewed the patients History and Physical, chart, labs and discussed the procedure including the risks, benefits and alternatives for the proposed anesthesia with the patient or authorized representative who has indicated his/her understanding and acceptance.     Dental advisory given  Plan Discussed with: CRNA  Anesthesia Plan Comments:         Anesthesia Quick Evaluation

## 2019-05-05 NOTE — H&P (View-Only) (Signed)
Cardiology Consult    Patient ID: Edward DubonnetJay Lawrence MRN: [id number], DOB/AGE: 47/05/1971   Admit date: 05/04/2019 Date of Consult: 05/05/2019  Primary Physician: Deatra JamesSun, Vyvyan, MD Primary [facility]ologist[facility] Allyson SabalBerry) Requesting Provider: Dr. Benjamine MolaVann  Patient Profile    Edward Lawrence is a [age] y.o. male with a history of hypertension and hyperlipidemia but no known cardiac disease, who is being seen today for the evaluation of chest at the request of Dr. Benjamine MolaVann.  History of Present Illness    Edward Lawrence is a 48 year old male with a history of hypertension and hyperlipidemia but no known cardiac disease. He has never seen a Cardiologist or had any type of cardiac work-up.  Patient presented to the ED yesterday for evaluation of chest pain. Patient reports intermittent chest pain that would radiate down both arms for the past 3 weeks. He describes the pain as a pressure and states it feels like someone is sitting on his chest. Patient initially thought the pain was just indigestion because it would occur after eating a meal and then going on a walk. Patient tried two over-the-counter antacids with no improvement. Patient also tried walking before eating and still had chest pain. Pain would resolve after stopping to rest for 10 minutes but would then quickly return when he started walking again. Patient feels like the frequency and intensity off the pain has increased over the last 3 weeks. Yesterday, patient was driving and had sudden onset of chest pain with radiation down his left arm. Patient states his left arm when numb and felt very week. Patient also reports tingling in his cheeks with pain. No associated shortness of breath, nausea, vomiting, or syncope. Patient has never had any chest pain prior to 3 weeks ago. Due intense episode of pain yesterday while driving, patient decided to come to the ED for further evaluation. No recent fevers, chills, or body aches. Patient reports some nasal congestion due  to allergies but denies any cough or known exposure to the coronavirus.   In the ED, patient hypertensive with BP of 162/101 but vitals stable. EKG reportedly showed some inferior Q waves and ST depression in the anterior leads (unable to personally review this remotely). Initial troponin negative. Chest x-ray showed no acute findings. WBC 7.5, Hgb 15.5, Plts 268. Na 137, K 3.3, Glucose 143, SCr 1.03. COVID-19 testing negative.  At the time of this evaluation, patient denies any additional episodes of chest pain since yesterday.  Patient denies any history of tobacco use. He reports rare alcohol use and denies any recreational drug use. Patient's father has a history of hypertension and a heart valve replace but he denies any other known family history of heart disease.   Past Medical History   Past Medical History:  Diagnosis Date  . High cholesterol   . Hypertension     History reviewed. No pertinent surgical history.   Allergies  Allergies  Allergen Reactions  . Azithromycin Other (See Comments)    Spikes BP    Inpatient Medications    . amLODipine  10 mg Oral Daily  . aspirin EC  81 mg Oral Daily  . atenolol  100 mg Oral Daily  . atorvastatin  10 mg Oral q1800  . enoxaparin (LOVENOX) injection  40 mg Subcutaneous Q24H  . losartan  100 mg Oral Daily   And  . hydrochlorothiazide  12.5 mg Oral Daily  . insulin aspart  0-15 Units Subcutaneous TID WC    Family  History    Family History  Problem Relation Age of Onset  . Other Mother 86       Shy-Drager Syndrome  . Hypertension Father   . Valvular heart disease Father    He indicated that his mother is deceased. He indicated that his father is alive.   Social History    Social History   Socioeconomic History  . Marital status: Married    Spouse name: Not on file  . Number of children: Not on file  . Years of education: Not on file  . Highest education level: Not on file  Occupational History  . Occupation:  Risk analyst  Social Needs  . Financial resource strain: Not on file  . Food insecurity:    Worry: Not on file    Inability: Not on file  . Transportation needs:    Medical: Not on file    Non-medical: Not on file  Tobacco Use  . Smoking status: Never Smoker  . Smokeless tobacco: Never Used  Substance and Sexual Activity  . Alcohol use: Never    Frequency: Never  . Drug use: Never  . Sexual activity: Not on file  Lifestyle  . Physical activity:    Days per week: Not on file    Minutes per session: Not on file  . Stress: Not on file  Relationships  . Social connections:    Talks on phone: Not on file    Gets together: Not on file    Attends religious service: Not on file    Active member of club or organization: Not on file    Attends meetings of clubs or organizations: Not on file    Relationship status: Not on file  . Intimate partner violence:    Fear of current or ex partner: Not on file    Emotionally abused: Not on file    Physically abused: Not on file    Forced sexual activity: Not on file  Other Topics Concern  . Not on file  Social History Narrative  . Not on file     Review of Systems    Review of Systems  Constitutional: Negative for chills and fever.  HENT: Positive for congestion. Negative for sore throat.   Respiratory: Negative for cough, hemoptysis and sputum production.   Cardiovascular: Positive for chest pain. Negative for palpitations.  Gastrointestinal: Negative for constipation, nausea and vomiting.  Genitourinary: Negative for hematuria.  Musculoskeletal: Negative for myalgias.  Neurological: Positive for tingling. Negative for loss of consciousness. Dizziness: lightheadedness.  Endo/Heme/Allergies: Does not bruise/bleed easily.  Psychiatric/Behavioral: Negative for substance abuse.    Physical Exam    Blood pressure (!) 141/95, pulse (!) 54, temperature (!) 97.5 F (36.4 C), temperature source Oral, resp. rate 20, height   (1.778 m), weight 92.9 kg, SpO2 99 %.   Physical Exam per MD:  General: 48 y.o. male resting comfortably in no acute distress. Pleasant and cooperative. HEENT: Normal  Neck: Supple. No carotid bruits or JVD appreciated. Lungs: No increased work of breathing. Clear to auscultation bilaterally. No wheezes, rhonchi, or rales. Heart: RRR. Distinct S1 and S2. No murmurs, gallops, or rubs.  Abdomen: Soft, non-distended, and non-tender to palpation. Bowel sounds present in all 4 quadrants.   Extremities: No clubbing, cyanosis or edema. Radial, posterior tibial, and distal pedal pulses 2+ and equal bilaterally. Skin: Warm and dry. Neuro: Alert and oriented x3. No focal deficits. Moves all extremities spontaneously. Psych: Normal affect.  Labs  Troponin (Point of Care Test) No results for input(s): TROPIPOC in the last 72 hours. Recent Labs    05/04/19 1015 05/04/19 1528 05/04/19 2105 05/05/19 0325  TROPONINI <0.03 0.10* 0.09* 0.06*   Lab Results  Component Value Date   WBC 7.5 05/04/2019   HGB 15.5 05/04/2019   HCT 47.2 05/04/2019   MCV 79.1 (L) 05/04/2019   PLT 268 05/04/2019    Recent Labs  Lab 05/04/19 1015  NA 137  K 3.3*  CL 103  CO2 20*  BUN 13  CREATININE 1.03  CALCIUM 9.5  GLUCOSE 143*   Lab Results  Component Value Date   CHOL 156 05/05/2019   HDL 34 (L) 05/05/2019   LDLCALC 101 (H) 05/05/2019   TRIG 107 05/05/2019   No results found for: [facility]   Radiology Studies    Dg Chest Port 1 View  Result Date: 05/04/2019 CLINICAL DATA:  Chest pain. EXAM: PORTABLE CHEST 1 VIEW COMPARISON:  None. FINDINGS: The heart size and mediastinal contours are within normal limits. Both lungs are clear. The visualized skeletal structures are unremarkable. IMPRESSION: No active disease. Electronically Signed   By: Obie Dredge M.D.   On: 05/04/2019 10:44    EKG     EKG: EKG was personally reviewed and demonstrates: Normal sinus rhythm at 79 without ST or T wave changes  Telemetry: Telemetry was personally reviewed and demonstrates: Sinus rhythm  Cardiac Imaging    None.  Assessment & Plan    Chest Pain Concerning for Unstable Angina - Patient presents with worsening exertional chest pain over the past 3 weeks with an episode at rest yesterday. He has a history of hypertension and hyperlipidemia but no known cardiac disease.  - EKG showed normal sinus rhythm with small but nondiagnostic inferior Q waves - Initial troponin negative. Repeat troponin minimally elevated and flat at 0.1 >> 0.09 >> 0.06. - LDL 101. - Hemoglobin A1c 5.2. - Patient currently chest pain free.  - Continue aspirin and statin. Will hold off on adding beta blocker due to bradycardia. - Presentation concerning for unstable angina/ACS. Suspect patient may need cardiac catheterization. Will discuss with MD.  Hypertension - BP elevated at 141/95.  - Continue home Amlodipine 10mg  daily and home Losartan-HCTZ 100-12.5mg  daily.  Hyperlipidemia - Lipid panel this admission: Cholesterol 156, Triglycerides 107, HDL 34, LDL 101. - Currently on Lipitor 10mg  daily at home. Will increase to 40mg  daily for high intensity status.    Signed, Corrin Parker, PA-C 05/05/2019, 12:04 PM Pager: [phone]  Agree with note by Marjie Skiff, PA-C  48 year old married Caucasian male with positive risk factors including hypertension hyperlipidemia describes 3 weeks of accelerated chest pain mostly exertional but also at rest.  The pain was worse yesterday going into both his arms and his jaw.  He is currently pain-free.  His exam is benign.  EKG shows no acute changes.  Small nondiagnostic inferior Q waves.  Enzymes are low and flat.  His story is good for unstable angina.  I favor diagnostic coronary angiography. The patient understands that risks included but are not limited to stroke (1 in 1000), death (1 in 1000), kidney failure [usually temporary] (1 in 500), bleeding (1 in 200), allergic  reaction [possibly serious] (1 in 200). The patient understands and agrees to proceed   Runell Gess, M.D., FACP, [facility], Kathryne Eriksson [facility] [address] [place], [place]  [address]  [phone] 05/05/2019 1:09 PM  For questions or  updates, please contact   Please consult [email or web address] for contact info under Cardiology/STEMI.

## 2019-05-05 NOTE — Progress Notes (Signed)
ANTICOAGULATION CONSULT NOTE - Initial Consult  Pharmacy Consult for heparin Indication: severe 3V CAD  Allergies  Allergen Reactions  . Azithromycin Other (See Comments)    Spikes BP    Patient Measurements: Height: 5\' 10"  (177.8 cm) Weight: 204 lb 14.4 oz (92.9 kg) IBW/kg (Calculated) : 73  Vital Signs: Temp: 97.5 F (36.4 C) (05/06 1152) Temp Source: Oral (05/06 1152) BP: 135/88 (05/06 1433) Pulse Rate: 0 (05/06 1433)  Labs: Recent Labs    05/04/19 1015 05/04/19 1528 05/04/19 2105 05/05/19 0325  HGB 15.5  --   --   --   HCT 47.2  --   --   --   PLT 268  --   --   --   CREATININE 1.03  --   --   --   TROPONINI <0.03 0.10* 0.09* 0.06*    Estimated Creatinine Clearance: 102.7 mL/min (by C-G formula based on SCr of 1.03 mg/dL).   Medical History: Past Medical History:  Diagnosis Date  . High cholesterol   . Hypertension     Medications:  Medications Prior to Admission  Medication Sig Dispense Refill Last Dose  . amLODipine (NORVASC) 10 MG tablet Take 10 mg by mouth daily.    05/04/2019 at Unknown time  . atenolol (TENORMIN) 100 MG tablet Take 100 mg by mouth.    05/04/2019 at 0930  . atorvastatin (LIPITOR) 10 MG tablet Take 10 mg by mouth every morning.    05/04/2019 at Unknown time  . cyclobenzaprine (FLEXERIL) 10 MG tablet Take 10 mg by mouth 3 (three) times daily as needed for muscle spasms.    unk  . fluticasone (FLONASE) 50 MCG/ACT nasal spray Place 1 spray into both nostrils daily.   05/03/2019 at Unknown time  . losartan-hydrochlorothiazide (HYZAAR) 100-12.5 MG tablet Take 1 tablet by mouth daily.    05/04/2019 at Unknown time  . magnesium oxide (MAG-OX) 400 MG tablet Take 400 mg by mouth daily.   05/04/2019 at Unknown time  . Omega-3 Fatty Acids (OMEGA-3 FISH OIL CONCENTRATE) 1000 MG CPDR Take 1,000 mg by mouth daily.   05/04/2019 at Unknown time  . testosterone (ANDROGEL) 50 MG/5GM (1%) GEL Place 5 g onto the skin daily.    Past Month at Unknown time     Assessment: 48 y/o male who presented to the ED on 05/04/2019 with CP for 3 weeks; no known CAD. No AC PTA. He is s/p cath and found to have severe 3V CAD. He is being evaluated for CABG. Pharmacy consulted to begin IV heparin 8 hr post sheath removal. TR band should be deflated ~17:30 per RN. Sheath removed ~14:30. No bleeding noted, CBC is normal.  Goal of Therapy:  Heparin level 0.3-0.7 units/ml Monitor platelets by anticoagulation protocol: Yes   Plan:  - Begin IV heparin at 1300 units/hr with no bolus at 22:00 - 6 hr heparin level - Daily heparin level and CBC - Monitor for s/sx of bleeding   Loura Back, PharmD, BCPS Clinical Pharmacist Clinical phone for 05/05/2019 until 10p is [phone] 05/05/2019 2:48 PM  **Pharmacist phone directory can now be found on [email or web address] listed under [facility]**

## 2019-05-05 NOTE — Progress Notes (Signed)
[facility] PROGRESS NOTE  Edward Lawrence ZOX:[id number] DOB: 02/07/1971 DOA: 05/04/2019 PCP: Deatra JamesSun, Vyvyan, MD  Assessment/Plan: Chest pain/some typical features and concerning for unstable angina/ACS. Heart score 4. CXR unremarkable. Troponin slightly elevated but flat. EKG with SR. Lipid panel within limits of normal, TSH 1.8, HgA1c 5.2, UDS negative.  Provided ASA 81 mg daily. Evaluated by cards who opine story concerning for unstable angina and recommend cardiac cath -continue aspirin - supportive therapy - appreciate Cardiology consultation  HTN. BP elevated. Home meds include Norvasc, Atenolol, Hyzaar -stpp BB due to bradycardia -continue losartan and HCTZ -hydralazine if needed for uncontrolled HTN  HLD -Lipitor at higher dose per cards -  DM - A1c 5.2 -There is no indication to start medication at this time  Hypokalemia: potassium 3.3. likely related to decrease po intake  -replete and recheck   Code Status: full Family Communication: plan discussed with patient Disposition Plan: home tomorrow   Consultants:  Allyson SabalBerry cardiology  Procedures:  Cath   Antibiotics:    HPI/Subjective: 47 you hx htn admitted chest pain rule out. Evaluated by cards who recommend cath. No further pain since admitted.   Objective: Vitals:   05/05/19 0749 05/05/19 1152  BP: 126/85 (!) 141/95  Pulse: 60 (!) 54  Resp: 20 20  Temp: 98.1 F (36.7 C) (!) 97.5 F (36.4 C)  SpO2: 97% 99%   No intake or output data in the 24 hours ending 05/05/19 1351 Filed Weights   05/04/19 1005 05/05/19 0641  Weight: 96.2 kg 92.9 kg    Exam:   General:  Awake alert oriented no acute distress  Cardiovascular: rrr no mgr no LE edema, chest non-tender to palp  Respiratory: normal effort BS clear bilaterally no wheeze  Abdomen: non-distended non-tender +BS no guarding or rebounding  Musculoskeletal: joints without swelling/erythema  Data Reviewed: Basic Metabolic Panel: Recent Labs   Lab 05/04/19 1015  NA 137  K 3.3*  CL 103  CO2 20*  GLUCOSE 143*  BUN 13  CREATININE 1.03  CALCIUM 9.5   Liver Function Tests: No results for input(s): AST, ALT, ALKPHOS, BILITOT, PROT, ALBUMIN in the last 168 hours. No results for input(s): LIPASE, AMYLASE in the last 168 hours. No results for input(s): AMMONIA in the last 168 hours. CBC: Recent Labs  Lab 05/04/19 1015  WBC 7.5  HGB 15.5  HCT 47.2  MCV 79.1*  PLT 268   Cardiac Enzymes: Recent Labs  Lab 05/04/19 1015 05/04/19 1528 05/04/19 2105 05/05/19 0325  TROPONINI <0.03 0.10* 0.09* 0.06*   BNP (last 3 results) No results for input(s): BNP in the last 8760 hours.  ProBNP (last 3 results) No results for input(s): PROBNP in the last 8760 hours.  CBG: Recent Labs  Lab 05/04/19 2159 05/05/19 0006 05/05/19 0631 05/05/19 0747 05/05/19 1150  GLUCAP 89 89 88 87 85    Recent Results (from the past 240 hour(s))  SARS Coronavirus 2 (Hosp order,Performed in [facility] lab via [employer])     Status: None   Collection Time: 05/04/19 11:26 AM  Result Value Ref Range Status   SARS Coronavirus 2 ([employer] Now) NEGATIVE NEGATIVE Final    Comment: (NOTE) Interpretive Result Comment(s): COVID 19 Positive SARS CoV 2 target nucleic acids are DETECTED. The SARS CoV 2 RNA is generally detectable in upper and lower respiratory specimens during the acute phase of infection.  Positive results are indicative of active infection with SARS CoV 2.  Clinical correlation with patient history and other  diagnostic information is necessary to determine patient infection status.  Positive results do not rule out bacterial infection or coinfection with other viruses. The expected result is Negative. COVID 19 Negative SARS CoV 2 target nucleic acids are NOT DETECTED. The SARS CoV 2 RNA is generally detectable in upper and lower respiratory specimens during the acute phase of infection.  Negative results do not preclude SARS  CoV 2 infection, do not rule out coinfections with other pathogens, and should not be used as the sole basis for treatment or other patient management decisions.  Negative results must be combined with clinical  observations, patient history, and epidemiological information. The expected result is Negative. Invalid Presence or absence of SARS CoV 2 nucleic acids cannot be determined. Repeat testing was performed on the submitted specimen and repeated Invalid results were obtained.  If clinically indicated, additional testing on a new specimen with an alternate test methodology [phone]) is advised.  The SARS CoV 2 RNA is generally detectable in upper and lower respiratory specimens during the acute phase of infection. The expected result is Negative. Fact Sheet for Patients:  http://www.graves-ford.org/ Fact Sheet for Healthcare Providers: EnviroConcern.si This test is not yet approved or cleared by the [place] FDA and has been authorized for detection and/or diagnosis of SARS CoV 2 by FDA under an Emergency Use Authorization (EUA).  This EUA will remain in effect (meaning this test can be used) for the duration of the COVID19 d eclaration under Section 564(b)(1) of the Act, 21 U.S.C. section [phone] 3(b)(1), unless the authorization is terminated or revoked sooner. Performed at [facility], [address], [place], [place] [address]      Studies: Dg Chest [facility] 1 View  Result Date: 05/04/2019 CLINICAL DATA:  Chest pain. EXAM: PORTABLE CHEST 1 VIEW COMPARISON:  None. FINDINGS: The heart size and mediastinal contours are within normal limits. Both lungs are clear. The visualized skeletal structures are unremarkable. IMPRESSION: No active disease. Electronically Signed   By: Obie Dredge M.D.   On: 05/04/2019 10:44    Scheduled Meds: . [MAR Hold] amLODipine  10 mg Oral Daily  . [START ON 05/06/2019] aspirin  81 mg Oral  Pre-Cath  . [MAR Hold] aspirin EC  81 mg Oral Daily  . [MAR Hold] atorvastatin  10 mg Oral q1800  . [MAR Hold] enoxaparin (LOVENOX) injection  40 mg Subcutaneous Q24H  . [MAR Hold] losartan  100 mg Oral Daily   And  . [MAR Hold] hydrochlorothiazide  12.5 mg Oral Daily  . [MAR Hold] insulin aspart  0-15 Units Subcutaneous TID WC  . sodium chloride flush  3 mL Intravenous Q12H   Continuous Infusions: . sodium chloride    . sodium chloride      Principal Problem:   Chest pain Active Problems:   Hypertension   Unstable angina (HCC)   High cholesterol   Hyperglycemia   Hypokalemia    Time spent: 45 minutes    [facility] M NP [facility]  If 7PM-7AM, please contact night-coverage at [email or web address], password [facility] 05/05/2019, 1:51 PM  LOS: 0 days

## 2019-05-06 ENCOUNTER — Encounter (HOSPITAL_COMMUNITY): Payer: Self-pay | Admitting: Cardiovascular Disease

## 2019-05-06 ENCOUNTER — Inpatient Hospital Stay (HOSPITAL_COMMUNITY): Payer: Commercial Managed Care - PPO | Admitting: Registered Nurse

## 2019-05-06 ENCOUNTER — Inpatient Hospital Stay (HOSPITAL_COMMUNITY): Payer: Commercial Managed Care - PPO

## 2019-05-06 ENCOUNTER — Inpatient Hospital Stay (HOSPITAL_COMMUNITY)
Admission: EM | Disposition: A | Discharge status: Home / Self Care | Payer: Self-pay | Source: Home / Self Care | Attending: Surgery

## 2019-05-06 DIAGNOSIS — Z951 Presence of aortocoronary bypass graft: Secondary | ICD-10-CM

## 2019-05-06 DIAGNOSIS — I25110 Atherosclerotic heart disease of native coronary artery with unstable angina pectoris: Secondary | ICD-10-CM

## 2019-05-06 HISTORY — PX: CORONARY ARTERY BYPASS GRAFT: SHX141

## 2019-05-06 HISTORY — PX: TEE WITHOUT CARDIOVERSION: SHX5443

## 2019-05-06 LAB — POCT I-STAT 7, (LYTES, BLD GAS, ICA,H+H)
Acid-base deficit: 4 mmol/L — ABNORMAL HIGH (ref 0.0–2.0)
Acid-base deficit: 3 mmol/L — ABNORMAL HIGH (ref 0.0–2.0)
Acid-base deficit: 5 mmol/L — ABNORMAL HIGH (ref 0.0–2.0)
Bicarbonate: 22.9 mmol/L (ref 20.0–28.0)
Bicarbonate: 21.7 mmol/L (ref 20.0–28.0)
Bicarbonate: 20.6 mmol/L (ref 20.0–28.0)
Calcium, Ion: 1.34 mmol/L (ref 1.15–1.40)
Calcium, Ion: 1.2 mmol/L (ref 1.15–1.40)
Calcium, Ion: 1.26 mmol/L (ref 1.15–1.40)
HCT: 35 % — ABNORMAL LOW (ref 39.0–52.0)
HCT: 36 % — ABNORMAL LOW (ref 39.0–52.0)
HCT: 35 % — ABNORMAL LOW (ref 39.0–52.0)
Hemoglobin: 11.9 g/dL — ABNORMAL LOW (ref 13.0–17.0)
Hemoglobin: 12.2 g/dL — ABNORMAL LOW (ref 13.0–17.0)
Hemoglobin: 11.9 g/dL — ABNORMAL LOW (ref 13.0–17.0)
O2 Saturation: 99 %
O2 Saturation: 98 %
O2 Saturation: 97 %
Patient temperature: 36.2
Patient temperature: 37
Patient temperature: 37.1
Potassium: 3.7 mmol/L (ref 3.5–5.1)
Potassium: 4 mmol/L (ref 3.5–5.1)
Potassium: 3.8 mmol/L (ref 3.5–5.1)
Sodium: 142 mmol/L (ref 135–145)
Sodium: 143 mmol/L (ref 135–145)
Sodium: 142 mmol/L (ref 135–145)
TCO2: 24 mmol/L (ref 22–32)
TCO2: 23 mmol/L (ref 22–32)
TCO2: 22 mmol/L (ref 22–32)
pCO2 arterial: 45.8 mmHg (ref 32.0–48.0)
pCO2 arterial: 38.9 mmHg (ref 32.0–48.0)
pCO2 arterial: 40.5 mmHg (ref 32.0–48.0)
pH, Arterial: 7.303 — ABNORMAL LOW (ref 7.350–7.450)
pH, Arterial: 7.355 (ref 7.350–7.450)
pH, Arterial: 7.315 — ABNORMAL LOW (ref 7.350–7.450)
pO2, Arterial: 142 mmHg — ABNORMAL HIGH (ref 83.0–108.0)
pO2, Arterial: 109 mmHg — ABNORMAL HIGH (ref 83.0–108.0)
pO2, Arterial: 104 mmHg (ref 83.0–108.0)

## 2019-05-06 LAB — BASIC METABOLIC PANEL
Anion gap: 15 (ref 5–15)
Anion gap: 8 (ref 5–15)
BUN: 12 mg/dL (ref 6–20)
BUN: 14 mg/dL (ref 6–20)
CO2: 22 mmol/L (ref 22–32)
CO2: 23 mmol/L (ref 22–32)
Calcium: 9.1 mg/dL (ref 8.9–10.3)
Calcium: 8.2 mg/dL — ABNORMAL LOW (ref 8.9–10.3)
Chloride: 104 mmol/L (ref 98–111)
Chloride: 110 mmol/L (ref 98–111)
Creatinine, Ser: 0.97 mg/dL (ref 0.61–1.24)
Creatinine, Ser: 1.08 mg/dL (ref 0.61–1.24)
GFR calc Af Amer: >60 mL/min (ref >60)
GFR calc Af Amer: >60 mL/min (ref >60)
GFR calc non Af Amer: >60 mL/min (ref >60)
GFR calc non Af Amer: >60 mL/min (ref >60)
Glucose, Bld: 116 mg/dL — ABNORMAL HIGH (ref 70–99)
Glucose, Bld: 138 mg/dL — ABNORMAL HIGH (ref 70–99)
Potassium: 3 mmol/L — ABNORMAL LOW (ref 3.5–5.1)
Potassium: 3.8 mmol/L (ref 3.5–5.1)
Sodium: 141 mmol/L (ref 135–145)
Sodium: 141 mmol/L (ref 135–145)

## 2019-05-06 LAB — CBC
HCT: 45.5 % (ref 39.0–52.0)
HCT: 37.5 % — ABNORMAL LOW (ref 39.0–52.0)
HCT: 37.4 % — ABNORMAL LOW (ref 39.0–52.0)
Hemoglobin: 15.2 g/dL (ref 13.0–17.0)
Hemoglobin: 12.3 g/dL — ABNORMAL LOW (ref 13.0–17.0)
Hemoglobin: 12.3 g/dL — ABNORMAL LOW (ref 13.0–17.0)
MCH: 26.2 pg (ref 26.0–34.0)
MCH: 26.3 pg (ref 26.0–34.0)
MCH: 26.4 pg (ref 26.0–34.0)
MCHC: 33.4 g/dL (ref 30.0–36.0)
MCHC: 32.8 g/dL (ref 30.0–36.0)
MCHC: 32.9 g/dL (ref 30.0–36.0)
MCV: 78.4 fL — ABNORMAL LOW (ref 80.0–100.0)
MCV: 80.3 fL (ref 80.0–100.0)
MCV: 80.3 fL (ref 80.0–100.0)
Platelets: 256 10*3/uL (ref 150–400)
Platelets: 216 10*3/uL (ref 150–400)
Platelets: 219 10*3/uL (ref 150–400)
RBC: 5.8 MIL/uL (ref 4.22–5.81)
RBC: 4.67 MIL/uL (ref 4.22–5.81)
RBC: 4.66 MIL/uL (ref 4.22–5.81)
RDW: 13 % (ref 11.5–15.5)
RDW: 13 % (ref 11.5–15.5)
RDW: 13 % (ref 11.5–15.5)
WBC: 9.9 10*3/uL (ref 4.0–10.5)
WBC: 18.7 10*3/uL — ABNORMAL HIGH (ref 4.0–10.5)
WBC: 18.2 10*3/uL — ABNORMAL HIGH (ref 4.0–10.5)
nRBC: 0 % (ref 0.0–0.2)
nRBC: 0 % (ref 0.0–0.2)
nRBC: 0 % (ref 0.0–0.2)

## 2019-05-06 LAB — GLUCOSE, CAPILLARY
Glucose-Capillary: 120 mg/dL — ABNORMAL HIGH (ref 70–99)
Glucose-Capillary: 121 mg/dL — ABNORMAL HIGH (ref 70–99)
Glucose-Capillary: 129 mg/dL — ABNORMAL HIGH (ref 70–99)
Glucose-Capillary: 130 mg/dL — ABNORMAL HIGH (ref 70–99)
Glucose-Capillary: 91 mg/dL (ref 70–99)

## 2019-05-06 LAB — SURGICAL PCR SCREEN
MRSA, PCR: NEGATIVE
Staphylococcus aureus: POSITIVE — AB

## 2019-05-06 LAB — POCT I-STAT 4, (NA,K, GLUC, HGB,HCT)
Glucose, Bld: 134 mg/dL — ABNORMAL HIGH (ref 70–99)
HCT: 34 % — ABNORMAL LOW (ref 39.0–52.0)
Hemoglobin: 11.6 g/dL — ABNORMAL LOW (ref 13.0–17.0)
Potassium: 3.8 mmol/L (ref 3.5–5.1)
Sodium: 143 mmol/L (ref 135–145)

## 2019-05-06 LAB — ECHO INTRAOPERATIVE TEE
Height: 70 in
Weight: 3230.4 oz

## 2019-05-06 LAB — PROTIME-INR
INR: 1.3 — ABNORMAL HIGH (ref 0.8–1.2)
Prothrombin Time: 16.4 seconds — ABNORMAL HIGH (ref 11.4–15.2)

## 2019-05-06 LAB — HEPARIN LEVEL (UNFRACTIONATED): Heparin Unfractionated: 0.12 IU/mL — ABNORMAL LOW (ref 0.30–0.70)

## 2019-05-06 LAB — PLATELET COUNT: Platelets: 264 10*3/uL (ref 150–400)

## 2019-05-06 LAB — MAGNESIUM: Magnesium: 3.1 mg/dL — ABNORMAL HIGH (ref 1.7–2.4)

## 2019-05-06 LAB — HEMOGLOBIN AND HEMATOCRIT, BLOOD
HCT: 33.6 % — ABNORMAL LOW (ref 39.0–52.0)
Hemoglobin: 11.1 g/dL — ABNORMAL LOW (ref 13.0–17.0)

## 2019-05-06 LAB — APTT: aPTT: 34 seconds (ref 24–36)

## 2019-05-06 SURGERY — CORONARY ARTERY BYPASS GRAFTING (CABG)
Anesthesia: General | Site: Chest

## 2019-05-06 MED ORDER — SODIUM CHLORIDE 0.9 % IV SOLN
INTRAVENOUS | Status: DC
  Administered 2019-05-06: 14:00:00 via INTRAVENOUS

## 2019-05-06 MED ORDER — PLASMA-LYTE 148 IV SOLN
INTRAVENOUS | Status: DC | PRN
  Administered 2019-05-06: 09:00:00 via INTRAVASCULAR

## 2019-05-06 MED ORDER — METOPROLOL TARTRATE 25 MG/10 ML ORAL SUSPENSION
12.5000 mg | Freq: Two times a day (BID) | ORAL | Status: DC
  Filled 2019-05-06: qty 5

## 2019-05-06 MED ORDER — CHLORHEXIDINE GLUCONATE CLOTH 2 % EX PADS
6.0000 | MEDICATED_PAD | Freq: Every day | CUTANEOUS | Status: DC
  Administered 2019-05-06 – 2019-05-08 (×3): 6 via TOPICAL

## 2019-05-06 MED ORDER — TRAMADOL HCL 50 MG PO TABS: 50.0000 mg | ORAL_TABLET | ORAL | Status: DC | PRN

## 2019-05-06 MED ORDER — 0.9 % SODIUM CHLORIDE (POUR BTL) OPTIME
TOPICAL | Status: DC | PRN
  Administered 2019-05-06: 09:00:00 1000 mL

## 2019-05-06 MED ORDER — LACTATED RINGERS IV SOLN
INTRAVENOUS | Status: DC | PRN
  Administered 2019-05-06: 07:00:00 via INTRAVENOUS

## 2019-05-06 MED ORDER — PROTAMINE SULFATE 10 MG/ML IV SOLN
INTRAVENOUS | Status: DC | PRN
  Administered 2019-05-06: 320 mg via INTRAVENOUS

## 2019-05-06 MED ORDER — PANTOPRAZOLE SODIUM 40 MG PO TBEC
40.0000 mg | DELAYED_RELEASE_TABLET | Freq: Every day | ORAL | Status: DC
  Administered 2019-05-08: 40 mg via ORAL
  Filled 2019-05-06: qty 1

## 2019-05-06 MED ORDER — THROMBIN 20000 UNITS EX SOLR
CUTANEOUS | Status: DC | PRN
  Administered 2019-05-06: 09:00:00 20000 [IU] via TOPICAL

## 2019-05-06 MED ORDER — OXYCODONE HCL 5 MG PO TABS
5.0000 mg | ORAL_TABLET | ORAL | Status: DC | PRN
  Administered 2019-05-06 – 2019-05-07 (×5): 10 mg via ORAL
  Filled 2019-05-06 (×5): qty 2

## 2019-05-06 MED ORDER — VANCOMYCIN HCL IN DEXTROSE 1-5 GM/200ML-% IV SOLN
1000.0000 mg | Freq: Once | INTRAVENOUS | Status: AC
  Administered 2019-05-06: 1000 mg via INTRAVENOUS
  Filled 2019-05-06: qty 200

## 2019-05-06 MED ORDER — ASPIRIN 81 MG PO CHEW: 324.0000 mg | CHEWABLE_TABLET | Freq: Every day | ORAL | Status: DC

## 2019-05-06 MED ORDER — ACETAMINOPHEN 500 MG PO TABS
1000.0000 mg | ORAL_TABLET | Freq: Four times a day (QID) | ORAL | Status: DC
  Administered 2019-05-06 – 2019-05-08 (×7): 1000 mg via ORAL
  Filled 2019-05-06 (×7): qty 2

## 2019-05-06 MED ORDER — ACETAMINOPHEN 650 MG RE SUPP
650.0000 mg | Freq: Once | RECTAL | Status: AC
  Administered 2019-05-06: 650 mg via RECTAL
  Filled 2019-05-06: qty 1

## 2019-05-06 MED ORDER — INSULIN REGULAR(HUMAN) IN NACL 100-0.9 UT/100ML-% IV SOLN: INTRAVENOUS | Status: DC

## 2019-05-06 MED ORDER — MIDAZOLAM HCL 5 MG/5ML IJ SOLN
INTRAMUSCULAR | Status: DC | PRN
  Administered 2019-05-06 (×2): 3 mg via INTRAVENOUS
  Administered 2019-05-06 (×2): 2 mg via INTRAVENOUS

## 2019-05-06 MED ORDER — SODIUM CHLORIDE 0.9% FLUSH: 3.0000 mL | INTRAVENOUS | Status: DC | PRN

## 2019-05-06 MED ORDER — ORAL CARE MOUTH RINSE
15.0000 mL | Freq: Two times a day (BID) | OROMUCOSAL | Status: DC
  Administered 2019-05-06 – 2019-05-10 (×7): 15 mL via OROMUCOSAL

## 2019-05-06 MED ORDER — ONDANSETRON HCL 4 MG/2ML IJ SOLN
4.0000 mg | Freq: Four times a day (QID) | INTRAMUSCULAR | Status: DC | PRN
  Administered 2019-05-06: 4 mg via INTRAVENOUS
  Filled 2019-05-06: qty 2

## 2019-05-06 MED ORDER — MIDAZOLAM HCL 2 MG/2ML IJ SOLN
INTRAMUSCULAR | Status: AC
  Filled 2019-05-06: qty 10

## 2019-05-06 MED ORDER — THROMBIN (RECOMBINANT) 20000 UNITS EX SOLR
CUTANEOUS | Status: AC
  Filled 2019-05-06: qty 20000

## 2019-05-06 MED ORDER — HEMOSTATIC AGENTS (NO CHARGE) OPTIME
TOPICAL | Status: DC | PRN
  Administered 2019-05-06: 1 via TOPICAL

## 2019-05-06 MED ORDER — HEPARIN SODIUM (PORCINE) 1000 UNIT/ML IJ SOLN
INTRAMUSCULAR | Status: DC | PRN
  Administered 2019-05-06: 32000 [IU] via INTRAVENOUS

## 2019-05-06 MED ORDER — ROCURONIUM BROMIDE 10 MG/ML (PF) SYRINGE
PREFILLED_SYRINGE | INTRAVENOUS | Status: DC | PRN
  Administered 2019-05-06 (×2): 50 mg via INTRAVENOUS
  Administered 2019-05-06: 100 mg via INTRAVENOUS
  Administered 2019-05-06: 50 mg via INTRAVENOUS
  Administered 2019-05-06: 30 mg via INTRAVENOUS

## 2019-05-06 MED ORDER — INSULIN REGULAR BOLUS VIA INFUSION
0.0000 [IU] | Freq: Three times a day (TID) | INTRAVENOUS | Status: DC
  Filled 2019-05-06: qty 10

## 2019-05-06 MED ORDER — ASPIRIN EC 325 MG PO TBEC
325.0000 mg | DELAYED_RELEASE_TABLET | Freq: Every day | ORAL | Status: DC
  Administered 2019-05-07 – 2019-05-08 (×2): 325 mg via ORAL
  Filled 2019-05-06 (×2): qty 1

## 2019-05-06 MED ORDER — LACTATED RINGERS IV SOLN: INTRAVENOUS | Status: DC

## 2019-05-06 MED ORDER — SODIUM CHLORIDE 0.45 % IV SOLN
INTRAVENOUS | Status: DC | PRN
  Administered 2019-05-06: 14:00:00 via INTRAVENOUS

## 2019-05-06 MED ORDER — MUPIROCIN 2 % EX OINT
1.0000 "application " | TOPICAL_OINTMENT | Freq: Two times a day (BID) | CUTANEOUS | Status: AC
  Administered 2019-05-06 – 2019-05-11 (×10): 1 via NASAL
  Filled 2019-05-06 (×3): qty 22

## 2019-05-06 MED ORDER — METOPROLOL TARTRATE 12.5 MG HALF TABLET
12.5000 mg | ORAL_TABLET | Freq: Two times a day (BID) | ORAL | Status: DC
  Administered 2019-05-06 – 2019-05-08 (×4): 12.5 mg via ORAL
  Filled 2019-05-06 (×4): qty 1

## 2019-05-06 MED ORDER — BISACODYL 5 MG PO TBEC
10.0000 mg | DELAYED_RELEASE_TABLET | Freq: Every day | ORAL | Status: DC
  Administered 2019-05-07 – 2019-05-08 (×2): 10 mg via ORAL
  Filled 2019-05-06 (×2): qty 2

## 2019-05-06 MED ORDER — NITROGLYCERIN IN D5W 200-5 MCG/ML-% IV SOLN: 0.0000 ug/min | INTRAVENOUS | Status: DC

## 2019-05-06 MED ORDER — PHENYLEPHRINE HCL-NACL 20-0.9 MG/250ML-% IV SOLN: 0.0000 ug/min | INTRAVENOUS | Status: DC

## 2019-05-06 MED ORDER — PROTAMINE SULFATE 10 MG/ML IV SOLN
INTRAVENOUS | Status: AC
  Filled 2019-05-06: qty 5

## 2019-05-06 MED ORDER — LIDOCAINE 2% (20 MG/ML) 5 ML SYRINGE
INTRAMUSCULAR | Status: AC
  Filled 2019-05-06: qty 5

## 2019-05-06 MED ORDER — DEXMEDETOMIDINE HCL IN NACL 400 MCG/100ML IV SOLN
0.0000 ug/kg/h | INTRAVENOUS | Status: DC
  Filled 2019-05-06: qty 100

## 2019-05-06 MED ORDER — ALBUMIN HUMAN 5 % IV SOLN: 250.0000 mL | INTRAVENOUS | Status: AC | PRN

## 2019-05-06 MED ORDER — CHLORHEXIDINE GLUCONATE 0.12 % MT SOLN
15.0000 mL | OROMUCOSAL | Status: AC
  Administered 2019-05-06: 15 mL via OROMUCOSAL

## 2019-05-06 MED ORDER — HEPARIN SODIUM (PORCINE) 1000 UNIT/ML IJ SOLN
INTRAMUSCULAR | Status: AC
  Filled 2019-05-06: qty 1

## 2019-05-06 MED ORDER — DOCUSATE SODIUM 100 MG PO CAPS
200.0000 mg | ORAL_CAPSULE | Freq: Every day | ORAL | Status: DC
  Administered 2019-05-07 – 2019-05-08 (×2): 200 mg via ORAL
  Filled 2019-05-06 (×2): qty 2

## 2019-05-06 MED ORDER — LACTATED RINGERS IV SOLN
INTRAVENOUS | Status: DC | PRN
  Administered 2019-05-06 (×2): via INTRAVENOUS

## 2019-05-06 MED ORDER — DEXAMETHASONE SODIUM PHOSPHATE 10 MG/ML IJ SOLN
INTRAMUSCULAR | Status: AC
  Filled 2019-05-06: qty 1

## 2019-05-06 MED ORDER — ACETAMINOPHEN 160 MG/5ML PO SOLN: 650.0000 mg | Freq: Once | ORAL | Status: AC

## 2019-05-06 MED ORDER — FAMOTIDINE IN NACL 20-0.9 MG/50ML-% IV SOLN
20.0000 mg | Freq: Two times a day (BID) | INTRAVENOUS | Status: DC
  Administered 2019-05-06: 20 mg via INTRAVENOUS
  Filled 2019-05-06 (×2): qty 50

## 2019-05-06 MED ORDER — SODIUM CHLORIDE 0.9 % IV SOLN
1.5000 g | Freq: Two times a day (BID) | INTRAVENOUS | Status: AC
  Administered 2019-05-06 – 2019-05-08 (×4): 1.5 g via INTRAVENOUS
  Filled 2019-05-06 (×4): qty 1.5

## 2019-05-06 MED ORDER — PROPOFOL 10 MG/ML IV BOLUS
INTRAVENOUS | Status: DC | PRN
  Administered 2019-05-06: 50 mg via INTRAVENOUS

## 2019-05-06 MED ORDER — ALBUMIN HUMAN 5 % IV SOLN
INTRAVENOUS | Status: DC | PRN
  Administered 2019-05-06 (×2): via INTRAVENOUS

## 2019-05-06 MED ORDER — SODIUM CHLORIDE 0.9% FLUSH
10.0000 mL | Freq: Two times a day (BID) | INTRAVENOUS | Status: DC
  Administered 2019-05-06 – 2019-05-08 (×4): 10 mL

## 2019-05-06 MED ORDER — ACETAMINOPHEN 160 MG/5ML PO SOLN: 1000.0000 mg | Freq: Four times a day (QID) | ORAL | Status: DC

## 2019-05-06 MED ORDER — MAGNESIUM SULFATE 4 GM/100ML IV SOLN
4.0000 g | Freq: Once | INTRAVENOUS | Status: AC
  Administered 2019-05-06: 4 g via INTRAVENOUS
  Filled 2019-05-06: qty 100

## 2019-05-06 MED ORDER — METOPROLOL TARTRATE 5 MG/5ML IV SOLN: 2.5000 mg | INTRAVENOUS | Status: DC | PRN

## 2019-05-06 MED ORDER — MIDAZOLAM HCL 2 MG/2ML IJ SOLN: 2.0000 mg | INTRAMUSCULAR | Status: DC | PRN

## 2019-05-06 MED ORDER — SODIUM CHLORIDE 0.9% FLUSH: 10.0000 mL | INTRAVENOUS | Status: DC | PRN

## 2019-05-06 MED ORDER — SODIUM BICARBONATE 8.4 % IV SOLN
50.0000 meq | Freq: Once | INTRAVENOUS | Status: AC
  Administered 2019-05-06: 50 meq via INTRAVENOUS

## 2019-05-06 MED ORDER — PROPOFOL 10 MG/ML IV BOLUS
INTRAVENOUS | Status: AC
  Filled 2019-05-06: qty 20

## 2019-05-06 MED ORDER — FENTANYL CITRATE (PF) 250 MCG/5ML IJ SOLN
INTRAMUSCULAR | Status: DC | PRN
  Administered 2019-05-06: 700 ug via INTRAVENOUS
  Administered 2019-05-06 (×2): 100 ug via INTRAVENOUS
  Administered 2019-05-06: 50 ug via INTRAVENOUS
  Administered 2019-05-06: 150 ug via INTRAVENOUS

## 2019-05-06 MED ORDER — FENTANYL CITRATE (PF) 250 MCG/5ML IJ SOLN
INTRAMUSCULAR | Status: AC
  Filled 2019-05-06: qty 25

## 2019-05-06 MED ORDER — PHENYLEPHRINE HCL (PRESSORS) 10 MG/ML IV SOLN
INTRAVENOUS | Status: AC
  Filled 2019-05-06: qty 2

## 2019-05-06 MED ORDER — SODIUM CHLORIDE 0.9% FLUSH
3.0000 mL | Freq: Two times a day (BID) | INTRAVENOUS | Status: DC
  Administered 2019-05-07 – 2019-05-08 (×3): 3 mL via INTRAVENOUS

## 2019-05-06 MED ORDER — ROCURONIUM BROMIDE 10 MG/ML (PF) SYRINGE
PREFILLED_SYRINGE | INTRAVENOUS | Status: AC
  Filled 2019-05-06: qty 10

## 2019-05-06 MED ORDER — MORPHINE SULFATE (PF) 2 MG/ML IV SOLN
1.0000 mg | INTRAVENOUS | Status: DC | PRN
  Administered 2019-05-06: 4 mg via INTRAVENOUS
  Administered 2019-05-06 (×2): 2 mg via INTRAVENOUS
  Administered 2019-05-06 – 2019-05-07 (×3): 4 mg via INTRAVENOUS
  Filled 2019-05-06: qty 1
  Filled 2019-05-06: qty 2
  Filled 2019-05-06: qty 1
  Filled 2019-05-06 (×2): qty 2
  Filled 2019-05-06 (×2): qty 1

## 2019-05-06 MED ORDER — LACTATED RINGERS IV SOLN: 500.0000 mL | Freq: Once | INTRAVENOUS | Status: DC | PRN

## 2019-05-06 MED ORDER — SUCCINYLCHOLINE CHLORIDE 20 MG/ML IJ SOLN
INTRAMUSCULAR | Status: DC | PRN
  Administered 2019-05-06: 100 mg via INTRAVENOUS

## 2019-05-06 MED ORDER — DEXAMETHASONE SODIUM PHOSPHATE 10 MG/ML IJ SOLN
INTRAMUSCULAR | Status: DC | PRN
  Administered 2019-05-06: 5 mg via INTRAVENOUS

## 2019-05-06 MED ORDER — POTASSIUM CHLORIDE 10 MEQ/50ML IV SOLN
10.0000 meq | INTRAVENOUS | Status: AC
  Administered 2019-05-06 (×3): 10 meq via INTRAVENOUS

## 2019-05-06 MED ORDER — SODIUM CHLORIDE 0.9 % IV SOLN: 250.0000 mL | INTRAVENOUS | Status: DC

## 2019-05-06 MED ORDER — BISACODYL 10 MG RE SUPP: 10.0000 mg | Freq: Every day | RECTAL | Status: DC

## 2019-05-06 MED ORDER — SUCCINYLCHOLINE CHLORIDE 200 MG/10ML IV SOSY
PREFILLED_SYRINGE | INTRAVENOUS | Status: AC
  Filled 2019-05-06: qty 10

## 2019-05-06 MED ORDER — PROTAMINE SULFATE 10 MG/ML IV SOLN
INTRAVENOUS | Status: AC
  Filled 2019-05-06: qty 25

## 2019-05-06 SURGICAL SUPPLY — 108 items
BAG DECANTER FOR FLEXI CONT (MISCELLANEOUS) ×4 IMPLANT
BANDAGE ACE 4X5 VEL STRL LF (GAUZE/BANDAGES/DRESSINGS) ×4 IMPLANT
BANDAGE ACE 6X5 VEL STRL LF (GAUZE/BANDAGES/DRESSINGS) ×4 IMPLANT
BASKET HEART (ORDER IN 25'S) (MISCELLANEOUS) ×1
BASKET HEART (ORDER IN 25S) (MISCELLANEOUS) ×3 IMPLANT
BLADE STERNUM SYSTEM 6 (BLADE) ×4 IMPLANT
BLADE SURG 15 STRL LF DISP TIS (BLADE) ×3 IMPLANT
BLADE SURG 15 STRL SS (BLADE) ×1
BNDG GAUZE ELAST 4 BULKY (GAUZE/BANDAGES/DRESSINGS) ×4 IMPLANT
CANISTER SUCT 3000ML PPV (MISCELLANEOUS) ×4 IMPLANT
CATH ROBINSON RED A/P 18FR (CATHETERS) ×8 IMPLANT
CATH THORACIC 28FR (CATHETERS) ×4 IMPLANT
CATH THORACIC 36FR (CATHETERS) ×4 IMPLANT
CATH THORACIC 36FR RT ANG (CATHETERS) ×4 IMPLANT
CLIP VESOCCLUDE MED 24/CT (CLIP) ×4 IMPLANT
CLIP VESOCCLUDE SM WIDE 24/CT (CLIP) ×12 IMPLANT
COVER MAYO STAND STRL (DRAPES) ×4 IMPLANT
COVER WAND RF STERILE (DRAPES) ×4 IMPLANT
CRADLE DONUT ADULT HEAD (MISCELLANEOUS) ×4 IMPLANT
DRAPE CARDIOVASCULAR INCISE (DRAPES) ×1
DRAPE EXTREMITY ABCS (DRAPES) ×4 IMPLANT
DRAPE HALF SHEET 40X57 (DRAPES) ×8 IMPLANT
DRAPE SLUSH/WARMER DISC (DRAPES) ×4 IMPLANT
DRAPE SRG 135X102X78XABS (DRAPES) ×3 IMPLANT
DRSG COVADERM 4X14 (GAUZE/BANDAGES/DRESSINGS) ×4 IMPLANT
ELECT CAUTERY BLADE 6.4 (BLADE) ×4 IMPLANT
ELECT REM PT RETURN 9FT ADLT (ELECTROSURGICAL) ×8
ELECTRODE REM PT RTRN 9FT ADLT (ELECTROSURGICAL) ×6 IMPLANT
FELT TEFLON 1X6 (MISCELLANEOUS) ×8 IMPLANT
GAUZE SPONGE 4X4 12PLY STRL (GAUZE/BANDAGES/DRESSINGS) ×8 IMPLANT
GAUZE SPONGE 4X4 12PLY STRL LF (GAUZE/BANDAGES/DRESSINGS) ×8 IMPLANT
GEL ULTRASOUND 20GR AQUASONIC (MISCELLANEOUS) ×4 IMPLANT
GLOVE BIO SURGEON STRL SZ 6 (GLOVE) ×12 IMPLANT
GLOVE BIO SURGEON STRL SZ 6.5 (GLOVE) ×20 IMPLANT
GLOVE BIO SURGEON STRL SZ7 (GLOVE) IMPLANT
GLOVE BIO SURGEON STRL SZ7.5 (GLOVE) ×8 IMPLANT
GLOVE BIOGEL PI IND STRL 6 (GLOVE) ×6 IMPLANT
GLOVE BIOGEL PI IND STRL 6.5 (GLOVE) IMPLANT
GLOVE BIOGEL PI IND STRL 7.0 (GLOVE) IMPLANT
GLOVE BIOGEL PI INDICATOR 6 (GLOVE) ×2
GLOVE BIOGEL PI INDICATOR 6.5 (GLOVE)
GLOVE BIOGEL PI INDICATOR 7.0 (GLOVE)
GLOVE EUDERMIC 7 POWDERFREE (GLOVE) ×8 IMPLANT
GLOVE ORTHO TXT STRL SZ7.5 (GLOVE) IMPLANT
GOWN STRL REUS W/ TWL LRG LVL3 (GOWN DISPOSABLE) ×12 IMPLANT
GOWN STRL REUS W/ TWL XL LVL3 (GOWN DISPOSABLE) ×3 IMPLANT
GOWN STRL REUS W/TWL LRG LVL3 (GOWN DISPOSABLE) ×4
GOWN STRL REUS W/TWL XL LVL3 (GOWN DISPOSABLE) ×1
HARMONIC SHEARS 14CM COAG (MISCELLANEOUS) IMPLANT
HEMOSTAT POWDER SURGIFOAM 1G (HEMOSTASIS) ×12 IMPLANT
HEMOSTAT SURGICEL 2X14 (HEMOSTASIS) ×4 IMPLANT
INSERT FOGARTY 61MM (MISCELLANEOUS) IMPLANT
INSERT FOGARTY XLG (MISCELLANEOUS) IMPLANT
KIT BASIN OR (CUSTOM PROCEDURE TRAY) ×4 IMPLANT
KIT CATH CPB BARTLE (MISCELLANEOUS) ×4 IMPLANT
KIT SUCTION CATH 14FR (SUCTIONS) ×4 IMPLANT
KIT TURNOVER KIT B (KITS) ×4 IMPLANT
KIT VASOVIEW HEMOPRO 2 VH 4000 (KITS) ×12 IMPLANT
NS IRRIG 1000ML POUR BTL (IV SOLUTION) ×20 IMPLANT
PACK E OPEN HEART (SUTURE) ×4 IMPLANT
PACK OPEN HEART (CUSTOM PROCEDURE TRAY) ×4 IMPLANT
PAD ARMBOARD 7.5X6 YLW CONV (MISCELLANEOUS) ×8 IMPLANT
PAD ELECT DEFIB RADIOL ZOLL (MISCELLANEOUS) ×4 IMPLANT
PENCIL BUTTON HOLSTER BLD 10FT (ELECTRODE) ×4 IMPLANT
PUNCH AORTIC ROTATE 4.0MM (MISCELLANEOUS) IMPLANT
PUNCH AORTIC ROTATE 4.5MM 8IN (MISCELLANEOUS) ×8 IMPLANT
PUNCH AORTIC ROTATE 5MM 8IN (MISCELLANEOUS) IMPLANT
SET CARDIOPLEGIA MPS 5001102 (MISCELLANEOUS) ×4 IMPLANT
SHEARS HARMONIC 9CM CVD (BLADE) IMPLANT
SPONGE INTESTINAL PEANUT (DISPOSABLE) IMPLANT
SPONGE LAP 18X18 RF (DISPOSABLE) IMPLANT
SPONGE LAP 4X18 RFD (DISPOSABLE) ×8 IMPLANT
SUT BONE WAX W31G (SUTURE) ×4 IMPLANT
SUT MNCRL AB 4-0 PS2 18 (SUTURE) IMPLANT
SUT PROLENE 3 0 SH DA (SUTURE) IMPLANT
SUT PROLENE 3 0 SH1 36 (SUTURE) ×4 IMPLANT
SUT PROLENE 4 0 RB 1 (SUTURE)
SUT PROLENE 4 0 SH DA (SUTURE) IMPLANT
SUT PROLENE 4-0 RB1 .5 CRCL 36 (SUTURE) IMPLANT
SUT PROLENE 5 0 C 1 36 (SUTURE) IMPLANT
SUT PROLENE 6 0 C 1 30 (SUTURE) IMPLANT
SUT PROLENE 7 0 BV 1 (SUTURE) ×4 IMPLANT
SUT PROLENE 7 0 BV1 MDA (SUTURE) ×4 IMPLANT
SUT PROLENE 8 0 BV175 6 (SUTURE) ×8 IMPLANT
SUT SILK  1 MH (SUTURE) ×1
SUT SILK 1 MH (SUTURE) ×3 IMPLANT
SUT SILK 2 0 SH (SUTURE) ×4 IMPLANT
SUT STEEL STERNAL CCS#1 18IN (SUTURE) ×8 IMPLANT
SUT STEEL SZ 6 DBL 3X14 BALL (SUTURE) IMPLANT
SUT VIC AB 1 CTX 36 (SUTURE) ×2
SUT VIC AB 1 CTX36XBRD ANBCTR (SUTURE) ×6 IMPLANT
SUT VIC AB 2-0 CT1 27 (SUTURE) ×1
SUT VIC AB 2-0 CT1 TAPERPNT 27 (SUTURE) ×3 IMPLANT
SUT VIC AB 2-0 CTX 27 (SUTURE) IMPLANT
SUT VIC AB 3-0 SH 27 (SUTURE)
SUT VIC AB 3-0 SH 27X BRD (SUTURE) IMPLANT
SUT VIC AB 3-0 X1 27 (SUTURE) ×4 IMPLANT
SUT VICRYL 4-0 PS2 18IN ABS (SUTURE) IMPLANT
SYR 50ML SLIP (SYRINGE) ×4 IMPLANT
SYSTEM SAHARA CHEST DRAIN ATS (WOUND CARE) ×4 IMPLANT
TAPE CLOTH SURG 4X10 WHT LF (GAUZE/BANDAGES/DRESSINGS) ×8 IMPLANT
TOWEL GREEN STERILE (TOWEL DISPOSABLE) ×4 IMPLANT
TOWEL GREEN STERILE FF (TOWEL DISPOSABLE) ×4 IMPLANT
TRAY FOLEY SLVR 16FR TEMP STAT (SET/KITS/TRAYS/PACK) ×4 IMPLANT
TUBING LAP HI FLOW INSUFFLATIO (TUBING) ×4 IMPLANT
UNDERPAD 30X30 (UNDERPADS AND DIAPERS) ×4 IMPLANT
WATER STERILE IRR 1000ML POUR (IV SOLUTION) ×8 IMPLANT
YANKAUER SUCT BULB TIP NO VENT (SUCTIONS) ×4 IMPLANT

## 2019-05-06 NOTE — Progress Notes (Signed)
Pt did > -40 on NIF And 1.7L on VC.

## 2019-05-06 NOTE — OR Nursing (Signed)
SICU called @1220  1st call Christine.

## 2019-05-06 NOTE — Anesthesia Procedure Notes (Signed)
Central Venous Catheter Insertion Performed by: Roderic Palau, MD, anesthesiologist Start/End5/06/2019 6:50 AM, 05/06/2019 7:05 AM Patient location: Pre-op. Preanesthetic checklist: patient identified, IV checked, site marked, risks and benefits discussed, surgical consent, monitors and equipment checked, pre-op evaluation, timeout performed and anesthesia consent Position: Trendelenburg Lidocaine 1% used for infiltration and patient sedated Hand hygiene performed , maximum sterile barriers used  and Seldinger technique used Catheter size: 9 Fr Total catheter length 10. Central line was placed.MAC introducer Procedure performed using ultrasound guided technique. Ultrasound Notes:anatomy identified, needle tip was noted to be adjacent to the nerve/plexus identified, no ultrasound evidence of intravascular and/or intraneural injection and image(s) printed for medical record Attempts: 1 Following insertion, line sutured, dressing applied and Biopatch. Post procedure assessment: blood return through all ports, free fluid flow and no air  Patient tolerated the procedure well with no immediate complications.

## 2019-05-06 NOTE — Anesthesia Procedure Notes (Signed)
Arterial Line Insertion Start/End5/06/2019 7:00 AM Performed by: Laruth Bouchard., CRNA, CRNA  Patient location: Pre-op. Preanesthetic checklist: patient identified, IV checked, site marked, risks and benefits discussed, surgical consent, monitors and equipment checked, pre-op evaluation, timeout performed and anesthesia consent Lidocaine 1% used for infiltration and patient sedated Right, radial was placed Catheter size: 20 G Hand hygiene performed  and maximum sterile barriers used   Attempts: 1 Procedure performed without using ultrasound guided technique. Following insertion, dressing applied and Biopatch. Post procedure assessment: normal  Patient tolerated the procedure well with no immediate complications.

## 2019-05-06 NOTE — Procedures (Signed)
Extubation Procedure Note  Patient Details:   Name: Edward Lawrence DOB: Jan 28, 1971 MRN: [id number]   Airway Documentation:    Vent end date: 05/06/19 Vent end time: 1655   Evaluation  O2 sats: stable throughout Complications: No apparent complications Patient did tolerate procedure well. Bilateral Breath Sounds: Clear   Yes   Leak test positive.  No stridor noted.  Avis Tirone 05/06/2019, 5:01 PM

## 2019-05-06 NOTE — Progress Notes (Signed)
  Echocardiogram 2D Echocardiogram has been performed.  Edward Lawrence 05/06/2019, 8:24 AM

## 2019-05-06 NOTE — OR Nursing (Signed)
2nd call to SICU charge @1252 

## 2019-05-06 NOTE — Progress Notes (Signed)
Patient's spouse Lawson Fiscal updated on patient's condition at 1500 after arrival to [facility]. Also updated at 1715 after patient was extubated. Stated she would video chat tomorrow. Password set up and documented in epic.  Leanna Battles, RN

## 2019-05-06 NOTE — Brief Op Note (Signed)
05/04/2019 - 05/06/2019  7:31 AM  PATIENT:  Edward Lawrence  48 y.o. male  PRE-OPERATIVE DIAGNOSIS:  SEVERE CAD  POST-OPERATIVE DIAGNOSIS:  SEVERE CAD  PROCEDURE:  Procedure(s): CORONARY ARTERY BYPASS GRAFTING (CABG) times three using bilateral mammary artery and right saphenous vein harvested with endoscope. (N/A) TRANSESOPHAGEAL ECHOCARDIOGRAM (TEE) (N/A) LIMA-LAD FREE RIMA-DIAG SVG-RCA  SURGEON:  Surgeon(s) and Role:    * Bartle, Payton Doughty, MD - Primary  PHYSICIAN ASSISTANT: WAYNE GOLD PA-C  ANESTHESIA:   general  EBL:  600 mL   BLOOD ADMINISTERED:none  DRAINS: ROUTINE PLEURAL AND PERICARDIAL CHEST DRAINS   LOCAL MEDICATIONS USED:  NONE  SPECIMEN:  No Specimen  DISPOSITION OF SPECIMEN:  N/A  COUNTS:  YES  TOURNIQUET:  * No tourniquets in log *  DICTATION: .Dragon Dictation  PLAN OF CARE: Admit to inpatient   PATIENT DISPOSITION:  ICU - intubated and hemodynamically stable.   Delay start of Pharmacological VTE agent (>24hrs) due to surgical blood loss or risk of bleeding: yes  COMPLICATIONS: NO KNOWN

## 2019-05-06 NOTE — Anesthesia Procedure Notes (Signed)
Procedure Name: Intubation Date/Time: 05/06/2019 7:48 AM Performed by: Trinna Post., CRNA Pre-anesthesia Checklist: Patient identified, Emergency Drugs available, Suction available, Patient being monitored and Timeout performed Patient Re-evaluated:Patient Re-evaluated prior to induction Oxygen Delivery Method: Circle system utilized Preoxygenation: Pre-oxygenation with 100% oxygen Induction Type: IV induction, Rapid sequence and Cricoid Pressure applied Laryngoscope Size: Mac and 4 Grade View: Grade I Tube type: Oral Tube size: 8.0 mm Number of attempts: 1 Airway Equipment and Method: Stylet Placement Confirmation: ETT inserted through vocal cords under direct vision,  positive ETCO2 and breath sounds checked- equal and bilateral Secured at: 23 cm Tube secured with: Tape Dental Injury: Teeth and Oropharynx as per pre-operative assessment

## 2019-05-06 NOTE — Progress Notes (Signed)
      [address]       [place] [address]             [phone]      S/p CABG x 3  Extubated BP 105/77   Pulse (!) 101   Temp 98.8 F (37.1 C)   Resp (!) 26   Ht 5\' 10"  (1.778 m)   Wt 91.6 kg   SpO2 98%   BMI 28.97 kg/m   25/12 CI= 2.8  Intake/Output Summary (Last 24 hours) at 05/06/2019 1752 Last data filed at 05/06/2019 1700 Gross per 24 hour  Intake 3438.36 ml  Output 1940 ml  Net 1498.36 ml   Hct= 37  Doing well POD # 1  Edward Kinley C. Dorris Fetch, MD [facility] [phone]

## 2019-05-06 NOTE — Op Note (Signed)
CARDIOVASCULAR SURGERY OPERATIVE NOTE  05/06/2019  Surgeon:  Alleen Borne, MD  First Assistant: Gershon Crane,  PA-C   Preoperative Diagnosis:  Severe multi-vessel coronary artery disease   Postoperative Diagnosis:  Same   Procedure:  1. Median Sternotomy 2. Extracorporeal circulation 3.   Coronary artery bypass grafting x 3   Left internal mammary artery to the LAD  Free right internal mammary artery to the diagonal 1  SVG to distal RCA  4.   Endoscopic vein harvest from the right leg   Anesthesia:  General Endotracheal   Clinical History/Surgical Indication:  The patient is a 48 year old gentleman with history of hypertension and hyperlipidemia who presents with a several week history of crescendo angina and minimal elevation of troponin levels.  Cardiac catheterization today shows severe three-vessel coronary disease with a 99% proximal LAD stenosis.  There is a large first diagonal that has 80% ostial stenosis.  Left circumflex is small and occluded proximally.  The right coronary artery has diffuse proximal and mid vessel 80% stenosis.  Left ventricular ejection fraction is 55 to 65%.  I agree that coronary bypass graft surgery is the best treatment for this patient to resolve his symptoms and prevent further ischemia and infarction.I discussed the operative procedure with the patient including alternatives, benefits and risks; including but not limited to bleeding, blood transfusion, infection, stroke, myocardial infarction, graft failure, heart block requiring a permanent pacemaker, organ dysfunction, and death. I discussed use of both internal mammary arteries. The right radial artery was used for cath. The left radial artery had a weak pulse and I was concerned that it would not be ideal for use as a graft. Monico Sudduth understands and agrees to proceed.   Preparation:  The  patient was seen in the preoperative holding area and the correct patient, correct operation were confirmed with the patient after reviewing the medical record and catheterization. The consent was signed by me. Preoperative antibiotics were given. A pulmonary arterial line and radial arterial line were placed by the anesthesia team. The patient was taken back to the operating room and positioned supine on the operating room table. After being placed under general endotracheal anesthesia by the anesthesia team a foley catheter was placed. The neck, chest, abdomen, and both legs were prepped with betadine soap and solution and draped in the usual sterile manner. A surgical time-out was taken and the correct patient and operative procedure were confirmed with the nursing and anesthesia staff.   Cardiopulmonary Bypass:  A median sternotomy was performed. The pericardium was opened in the midline. Right ventricular function appeared normal. The ascending aorta was of normal size and had no palpable plaque. There were no contraindications to aortic cannulation or cross-clamping. The patient was fully systemically heparinized and the ACT was maintained > 400 sec. The proximal aortic arch was cannulated with a 20 F aortic cannula for arterial inflow. Venous cannulation was performed via the right atrial appendage using a two-staged venous cannula. An antegrade cardioplegia/vent cannula was inserted into the mid-ascending aorta. Aortic occlusion was performed with a single cross-clamp. Systemic cooling to 32 degrees Centigrade and topical cooling of the heart with iced saline were used. Hyperkalemic antegrade cold blood cardioplegia was used to induce diastolic arrest and was then given at about 20 minute intervals throughout the period of arrest to maintain myocardial temperature at or below 10 degrees centigrade. A temperature probe was inserted into the interventricular septum and an insulating pad was placed in the  pericardium.  Left internal mammary artery harvest:  The left side of the sternum was retracted using the Rultract retractor. The left internal mammary artery was harvested as a pedicle graft. All side branches were clipped. It was a medium-sized vessel of good quality with excellent blood flow. It was ligated distally and divided. It was sprayed with topical papaverine solution to prevent vasospasm.  Right internal mammary artery harvest:  The right side of the sternum was retracted using the Rultract retractor. The right internal mammary artery was harvested as a pedicle graft. All side branches were clipped. It was a medium-sized vessel of good quality with excellent blood flow. It was ligated distally and divided. It was sprayed with topical papaverine solution to prevent vasospasm. It did not appear long enough to reach to the distal RCA even as a free graft so I decided to use it as a free graft to the diagonal. It was divided proximally and the stump suture ligated with 2-0 silk suture.    Endoscopic vein harvest:  The right greater saphenous vein was harvested endoscopically through a 2 cm incision medial to the right knee. It was harvested from the thigh. It was a medium-sized vein of good quality. The side branches were all ligated with 4-0 silk ties.    Coronary arteries:  The coronary arteries were examined.   LAD:  Large vessel with mild segmental mid and distal vessel disease. The diagonal was visible proximally before it became intramyocardial. It was a large vessel and had proximal disease. It was traced slightly into the muscle where it was soft with no disease.  LCX:  Small, non dominant vessel with a tiny marginal branch. Not graftable.  RCA:  Diffusely diseased extending to the PDA origin. The PDA was small and lying immediately adjacent to the PD vein. The PL branches were also small. The distal RCA beyond the PDA origin was a moderate caliber vessel with minimal  disease.   Grafts:  1. LIMA to the LAD: 2.0 mm. It was sewn end to side using 8-0 prolene continuous suture. 2. Free RIMA to the diagonal:  1.75 mm. It was sewn end to side using 8-0 prolene continuous suture. 3. SVG to distal RCA:  1.75 mm. It was sewn end to side using 7-0 prolene continuous suture.   The proximal vein graft anastomosis was performed to the mid-ascending aorta using continuous 6-0 prolene suture. The proximal anastomosis of the RIMA graft was performed directly to the ascending aorta since it was soft and not diseased in end to side manner using continuous 7-0 prolene suture. Graft markers were placed around the proximal anastomoses.   Completion:  The patient was rewarmed to 37 degrees Centigrade. The clamp was removed from the LIMA pedicle and there was rapid warming of the septum and return of ventricular fibrillation. The crossclamp was removed with a time of 76 minutes. There was spontaneous return of sinus rhythm. The distal and proximal anastomoses were checked for hemostasis. The position of the grafts was satisfactory. Two temporary epicardial pacing wires were placed on the right atrium and two on the right ventricle. The patient was weaned from CPB without difficulty on no inotropes. CPB time was 101 minutes. Cardiac output was 5.5 LPM. TEE showed normal LV systolic function. Heparin was fully reversed with protamine and the aortic and venous cannulas removed. Hemostasis was achieved. Mediastinal and left pleural drainage tubes were placed. The sternum was closed with double #6 stainless steel wires. The fascia was closed with continuous #  1 vicryl suture. The subcutaneous tissue was closed with 2-0 vicryl continuous suture. The skin was closed with 3-0 vicryl subcuticular suture. All sponge, needle, and instrument counts were reported correct at the end of the case. Dry sterile dressings were placed over the incisions and around the chest tubes which were connected to  pleurevac suction. The patient was then transported to the surgical intensive care unit in stable condition.

## 2019-05-06 NOTE — Progress Notes (Signed)
Patient went to CABG today.  Appreciated care by CVTS.  Please let [facility] know if we can be of further assistance. Marlin Canary DO

## 2019-05-06 NOTE — Anesthesia Postprocedure Evaluation (Signed)
Anesthesia Post Note  Patient: Edward Lawrence  Procedure(s) Performed: CORONARY ARTERY BYPASS GRAFTING (CABG) times three using bilateral mammary artery and right saphenous vein harvested with endoscope. (N/A Chest) TRANSESOPHAGEAL ECHOCARDIOGRAM (TEE) (N/A )     Patient location during evaluation: SICU Anesthesia Type: General Level of consciousness: sedated Pain management: pain level controlled Vital Signs Assessment: post-procedure vital signs reviewed and stable Respiratory status: patient remains intubated per anesthesia plan Cardiovascular status: stable Postop Assessment: no apparent nausea or vomiting Anesthetic complications: no    Last Vitals:  Vitals:   05/06/19 0705 05/06/19 0706  BP:    Pulse: 75 75  Resp: 19 15  Temp:    SpO2: 100% 100%    Last Pain:  Vitals:   05/06/19 0535  TempSrc: Oral  PainSc:                  Monchel Pollitt,W. EDMOND

## 2019-05-06 NOTE — Anesthesia Procedure Notes (Signed)
Central Venous Catheter Insertion Performed by: Gaynelle Adu, MD, anesthesiologist Start/End5/06/2019 6:50 AM, 05/06/2019 7:05 AM Patient location: Pre-op. Preanesthetic checklist: patient identified, IV checked, site marked, risks and benefits discussed, surgical consent, monitors and equipment checked, pre-op evaluation, timeout performed and anesthesia consent Hand hygiene performed  and maximum sterile barriers used  PA cath was placed.Swan type:thermodilution PA Cath depth:50 Procedure performed without using ultrasound guided technique. Attempts: 1 Patient tolerated the procedure well with no immediate complications.

## 2019-05-06 NOTE — Transfer of Care (Signed)
Immediate Anesthesia Transfer of Care Note  Patient: Edward Lawrence  Procedure(s) Performed: CORONARY ARTERY BYPASS GRAFTING (CABG) times three using bilateral mammary artery and right saphenous vein harvested with endoscope. (N/A Chest) TRANSESOPHAGEAL ECHOCARDIOGRAM (TEE) (N/A )  Patient Location: PACU and ICU  Anesthesia Type:General  Level of Consciousness: sedated and Patient remains intubated per anesthesia plan  Airway & Oxygen Therapy: Patient remains intubated per anesthesia plan and Patient placed on Ventilator (see vital sign flow sheet for setting)  Post-op Assessment: Report given to RN and Post -op Vital signs reviewed and stable  Post vital signs: Reviewed and stable  Last Vitals:  Vitals Value Taken Time  BP 128/94 05/06/2019  1:48 PM  Temp    Pulse 89 05/06/2019  1:50 PM  Resp 12 05/06/2019  1:50 PM  SpO2 100 % 05/06/2019  1:50 PM  Vitals shown include unvalidated device data.  Last Pain:  Vitals:   05/06/19 0535  TempSrc: Oral  PainSc:       Patients Stated Pain Goal: 0 (05/05/19 2000)  Complications: No apparent anesthesia complications

## 2019-05-07 ENCOUNTER — Encounter (HOSPITAL_COMMUNITY): Payer: Self-pay | Admitting: Surgery

## 2019-05-07 ENCOUNTER — Inpatient Hospital Stay (HOSPITAL_COMMUNITY): Payer: Commercial Managed Care - PPO

## 2019-05-07 LAB — CBC
HCT: 34.5 % — ABNORMAL LOW (ref 39.0–52.0)
HCT: 34.6 % — ABNORMAL LOW (ref 39.0–52.0)
Hemoglobin: 11.1 g/dL — ABNORMAL LOW (ref 13.0–17.0)
Hemoglobin: 11 g/dL — ABNORMAL LOW (ref 13.0–17.0)
MCH: 26.4 pg (ref 26.0–34.0)
MCH: 26.3 pg (ref 26.0–34.0)
MCHC: 32.2 g/dL (ref 30.0–36.0)
MCHC: 31.8 g/dL (ref 30.0–36.0)
MCV: 81.9 fL (ref 80.0–100.0)
MCV: 82.8 fL (ref 80.0–100.0)
Platelets: 205 10*3/uL (ref 150–400)
Platelets: 199 10*3/uL (ref 150–400)
RBC: 4.21 MIL/uL — ABNORMAL LOW (ref 4.22–5.81)
RBC: 4.18 MIL/uL — ABNORMAL LOW (ref 4.22–5.81)
RDW: 13.3 % (ref 11.5–15.5)
RDW: 13.6 % (ref 11.5–15.5)
WBC: 17.1 10*3/uL — ABNORMAL HIGH (ref 4.0–10.5)
WBC: 18.7 10*3/uL — ABNORMAL HIGH (ref 4.0–10.5)
nRBC: 0 % (ref 0.0–0.2)
nRBC: 0 % (ref 0.0–0.2)

## 2019-05-07 LAB — BASIC METABOLIC PANEL
Anion gap: 12 (ref 5–15)
Anion gap: 7 (ref 5–15)
BUN: 14 mg/dL (ref 6–20)
BUN: 19 mg/dL (ref 6–20)
CO2: 22 mmol/L (ref 22–32)
CO2: 24 mmol/L (ref 22–32)
Calcium: 7.8 mg/dL — ABNORMAL LOW (ref 8.9–10.3)
Calcium: 8.3 mg/dL — ABNORMAL LOW (ref 8.9–10.3)
Chloride: 104 mmol/L (ref 98–111)
Chloride: 107 mmol/L (ref 98–111)
Creatinine, Ser: 0.89 mg/dL (ref 0.61–1.24)
Creatinine, Ser: 1.07 mg/dL (ref 0.61–1.24)
GFR calc Af Amer: >60 mL/min (ref >60)
GFR calc Af Amer: >60 mL/min (ref >60)
GFR calc non Af Amer: >60 mL/min (ref >60)
GFR calc non Af Amer: >60 mL/min (ref >60)
Glucose, Bld: 137 mg/dL — ABNORMAL HIGH (ref 70–99)
Glucose, Bld: 169 mg/dL — ABNORMAL HIGH (ref 70–99)
Potassium: 3.7 mmol/L (ref 3.5–5.1)
Potassium: 4.2 mmol/L (ref 3.5–5.1)
Sodium: 138 mmol/L (ref 135–145)
Sodium: 138 mmol/L (ref 135–145)

## 2019-05-07 LAB — GLUCOSE, CAPILLARY
Glucose-Capillary: 85 mg/dL (ref 70–99)
Glucose-Capillary: 109 mg/dL — ABNORMAL HIGH (ref 70–99)
Glucose-Capillary: 109 mg/dL — ABNORMAL HIGH (ref 70–99)
Glucose-Capillary: 113 mg/dL — ABNORMAL HIGH (ref 70–99)
Glucose-Capillary: 153 mg/dL — ABNORMAL HIGH (ref 70–99)
Glucose-Capillary: 85 mg/dL (ref 70–99)
Glucose-Capillary: 102 mg/dL — ABNORMAL HIGH (ref 70–99)
Glucose-Capillary: 133 mg/dL — ABNORMAL HIGH (ref 70–99)
Glucose-Capillary: 137 mg/dL — ABNORMAL HIGH (ref 70–99)
Glucose-Capillary: 140 mg/dL — ABNORMAL HIGH (ref 70–99)
Glucose-Capillary: 143 mg/dL — ABNORMAL HIGH (ref 70–99)
Glucose-Capillary: 170 mg/dL — ABNORMAL HIGH (ref 70–99)
Glucose-Capillary: 174 mg/dL — ABNORMAL HIGH (ref 70–99)
Glucose-Capillary: 110 mg/dL — ABNORMAL HIGH (ref 70–99)
Glucose-Capillary: 113 mg/dL — ABNORMAL HIGH (ref 70–99)

## 2019-05-07 LAB — MAGNESIUM
Magnesium: 2.4 mg/dL (ref 1.7–2.4)
Magnesium: 2.2 mg/dL (ref 1.7–2.4)

## 2019-05-07 MED ORDER — POTASSIUM CHLORIDE CRYS ER 20 MEQ PO TBCR
20.0000 meq | EXTENDED_RELEASE_TABLET | ORAL | Status: AC
  Administered 2019-05-07 (×3): 20 meq via ORAL
  Filled 2019-05-07 (×3): qty 1

## 2019-05-07 MED ORDER — INSULIN ASPART 100 UNIT/ML ~~LOC~~ SOLN
0.0000 [IU] | SUBCUTANEOUS | Status: DC
  Administered 2019-05-07 (×2): 4 [IU] via SUBCUTANEOUS

## 2019-05-07 MED ORDER — FUROSEMIDE 10 MG/ML IJ SOLN
40.0000 mg | Freq: Once | INTRAMUSCULAR | Status: AC
  Administered 2019-05-07: 40 mg via INTRAVENOUS
  Filled 2019-05-07: qty 4

## 2019-05-07 MED ORDER — INSULIN DETEMIR 100 UNIT/ML ~~LOC~~ SOLN
10.0000 [IU] | Freq: Once | SUBCUTANEOUS | Status: AC
  Administered 2019-05-07: 10 [IU] via SUBCUTANEOUS
  Filled 2019-05-07: qty 0.1

## 2019-05-07 MED ORDER — SODIUM CHLORIDE 0.9 % IV SOLN
INTRAVENOUS | Status: DC | PRN
  Administered 2019-05-07: 23:00:00 250 mL via INTRAVENOUS

## 2019-05-07 MED ORDER — ENOXAPARIN SODIUM 40 MG/0.4ML ~~LOC~~ SOLN
40.0000 mg | Freq: Every day | SUBCUTANEOUS | Status: DC
  Administered 2019-05-07 – 2019-05-10 (×4): 40 mg via SUBCUTANEOUS
  Filled 2019-05-07 (×4): qty 0.4

## 2019-05-07 MED FILL — Thrombin (Recombinant) For Soln 20000 Unit: CUTANEOUS | Qty: 1 | Status: AC

## 2019-05-07 MED FILL — Magnesium Sulfate Inj 50%: INTRAMUSCULAR | Qty: 10 | Status: AC

## 2019-05-07 MED FILL — Heparin Sodium (Porcine) Inj 1000 Unit/ML: INTRAMUSCULAR | Qty: 30 | Status: AC

## 2019-05-07 MED FILL — Potassium Chloride Inj 2 mEq/ML: INTRAVENOUS | Qty: 40 | Status: AC

## 2019-05-07 NOTE — Progress Notes (Signed)
1 Day Post-Op Procedure(s) (LRB): CORONARY ARTERY BYPASS GRAFTING (CABG) times three using bilateral mammary artery and right saphenous vein harvested with endoscope. (N/A) TRANSESOPHAGEAL ECHOCARDIOGRAM (TEE) (N/A) Subjective: Sore but otherwise feels ok  Objective: Vital signs in last 24 hours: Temp:  [97.2 F (36.2 C)-100 F (37.8 C)] 99.7 F (37.6 C) (05/08 0600) Pulse Rate:  [87-104] 99 (05/08 0600) Cardiac Rhythm: Normal sinus rhythm (05/08 0400) Resp:  [12-35] 20 (05/08 0600) BP: (95-133)/(65-94) 95/65 (05/08 0600) SpO2:  [92 %-100 %] 92 % (05/08 0600) Arterial Line BP: (68-122)/(49-97) 89/85 (05/08 0400) FiO2 (%):  [50 %] 50 % (05/07 1559) Weight:  [96.6 kg] 96.6 kg (05/08 0500)  Hemodynamic parameters for last 24 hours: PAP: (19-47)/(6-24) 39/24 CO:  [4.8 L/min-6.4 L/min] 5.3 L/min CI:  [2.3 L/min/m2-3.1 L/min/m2] 2.5 L/min/m2  Intake/Output from previous day: 05/07 0701 - 05/08 0700 In: 5176.2 [P.O.:720; I.V.:2959.6; Blood:300; IV Piggyback:1196.7] Out: 5093 [OIZTI:4580; Blood:600; Chest Tube:550] Intake/Output this shift: No intake/output data recorded.  General appearance: alert and cooperative Neurologic: intact Heart: regular rate and rhythm, S1, S2 normal, no murmur, click, rub or gallop Lungs: clear to auscultation bilaterally Extremities: edema mild Wound: dressing dry  Lab Results: Recent Labs    05/06/19 1936 05/06/19 1942 05/07/19 0354  WBC 18.2*  --  17.1*  HGB 12.3* 11.6* 11.1*  HCT 37.4* 34.0* 34.5*  PLT 219  --  205   BMET:  Recent Labs    05/06/19 1936 05/06/19 1942 05/07/19 0354  NA 141 143 138  K 3.8 3.8 3.7  CL 110  --  104  CO2 23  --  22  GLUCOSE 138* 134* 137*  BUN 14  --  14  CREATININE 1.08  --  0.89  CALCIUM 8.2*  --  7.8*    PT/INR:  Recent Labs    05/06/19 1420  LABPROT 16.4*  INR 1.3*   ABG    Component Value Date/Time   PHART 7.315 (L) 05/06/2019 1803   HCO3 20.6 05/06/2019 1803   TCO2 22 05/06/2019  1803   ACIDBASEDEF 5.0 (H) 05/06/2019 1803   O2SAT 97.0 05/06/2019 1803   CBG (last 3)  Recent Labs    05/06/19 1401 05/06/19 1501 05/06/19 1604  GLUCAP 130* 120* 129*   CXR: mild segmental atelelectasis  ECG: sinus, no acute changes.  Assessment/Plan: S/P Procedure(s) (LRB): CORONARY ARTERY BYPASS GRAFTING (CABG) times three using bilateral mammary artery and right saphenous vein harvested with endoscope. (N/A) TRANSESOPHAGEAL ECHOCARDIOGRAM (TEE) (N/A)  POD 1 Hemodynamically stable in sinus rhythm. Continue low dose Lopressor.  DC all chest tubes after dangle.  IS, OOB, ambulate later  Glucose under good control but still on insulin drip. No hx of DM and Hgb A1c was 5.2. Will give him a dose of Levemir this am and start SSI until glucose stable off insulin drip.  Diurese.   LOS: 2 days    Alleen Borne 05/07/2019

## 2019-05-07 NOTE — Progress Notes (Signed)
Patient ID: Edward Lawrence, male   DOB: 05-19-1971, 48 y.o.   MRN: [id number] T[facility] Evening Rounds:  Hemodynamically stable today Chest tubes out. Diuresed.  CBC    Component Value Date/Time   WBC 18.7 (H) 05/07/2019 1713   RBC 4.18 (L) 05/07/2019 1713   HGB 11.0 (L) 05/07/2019 1713   HCT 34.6 (L) 05/07/2019 1713   PLT 199 05/07/2019 1713   MCV 82.8 05/07/2019 1713   MCH 26.3 05/07/2019 1713   MCHC 31.8 05/07/2019 1713   RDW 13.6 05/07/2019 1713   BMET pending

## 2019-05-08 ENCOUNTER — Inpatient Hospital Stay (HOSPITAL_COMMUNITY): Payer: Commercial Managed Care - PPO

## 2019-05-08 LAB — BASIC METABOLIC PANEL
Anion gap: 9 (ref 5–15)
BUN: 12 mg/dL (ref 6–20)
CO2: 25 mmol/L (ref 22–32)
Calcium: 7.8 mg/dL — ABNORMAL LOW (ref 8.9–10.3)
Chloride: 105 mmol/L (ref 98–111)
Creatinine, Ser: 0.88 mg/dL (ref 0.61–1.24)
GFR calc Af Amer: >60 mL/min (ref >60)
GFR calc non Af Amer: >60 mL/min (ref >60)
Glucose, Bld: 125 mg/dL — ABNORMAL HIGH (ref 70–99)
Potassium: 3.6 mmol/L (ref 3.5–5.1)
Sodium: 139 mmol/L (ref 135–145)

## 2019-05-08 LAB — GLUCOSE, CAPILLARY
Glucose-Capillary: 125 mg/dL — ABNORMAL HIGH (ref 70–99)
Glucose-Capillary: 105 mg/dL — ABNORMAL HIGH (ref 70–99)
Glucose-Capillary: 99 mg/dL (ref 70–99)
Glucose-Capillary: 118 mg/dL — ABNORMAL HIGH (ref 70–99)
Glucose-Capillary: 135 mg/dL — ABNORMAL HIGH (ref 70–99)

## 2019-05-08 LAB — CBC
HCT: 30.7 % — ABNORMAL LOW (ref 39.0–52.0)
Hemoglobin: 9.7 g/dL — ABNORMAL LOW (ref 13.0–17.0)
MCH: 26.1 pg (ref 26.0–34.0)
MCHC: 31.6 g/dL (ref 30.0–36.0)
MCV: 82.5 fL (ref 80.0–100.0)
Platelets: 143 10*3/uL — ABNORMAL LOW (ref 150–400)
RBC: 3.72 MIL/uL — ABNORMAL LOW (ref 4.22–5.81)
RDW: 13.6 % (ref 11.5–15.5)
WBC: 14.2 10*3/uL — ABNORMAL HIGH (ref 4.0–10.5)
nRBC: 0 % (ref 0.0–0.2)

## 2019-05-08 MED ORDER — SODIUM CHLORIDE 0.9% FLUSH: 3.0000 mL | INTRAVENOUS | Status: DC | PRN

## 2019-05-08 MED ORDER — BISACODYL 10 MG RE SUPP: 10.0000 mg | Freq: Every day | RECTAL | Status: DC | PRN

## 2019-05-08 MED ORDER — POTASSIUM CHLORIDE CRYS ER 20 MEQ PO TBCR
20.0000 meq | EXTENDED_RELEASE_TABLET | ORAL | Status: DC
  Administered 2019-05-08 (×2): 20 meq via ORAL
  Filled 2019-05-08 (×2): qty 1

## 2019-05-08 MED ORDER — SODIUM CHLORIDE 0.9% FLUSH
3.0000 mL | Freq: Two times a day (BID) | INTRAVENOUS | Status: DC
  Administered 2019-05-08 – 2019-05-10 (×5): 3 mL via INTRAVENOUS

## 2019-05-08 MED ORDER — ASPIRIN EC 325 MG PO TBEC
325.0000 mg | DELAYED_RELEASE_TABLET | Freq: Every day | ORAL | Status: DC
  Administered 2019-05-09 – 2019-05-11 (×3): 325 mg via ORAL
  Filled 2019-05-08 (×3): qty 1

## 2019-05-08 MED ORDER — DOCUSATE SODIUM 100 MG PO CAPS
200.0000 mg | ORAL_CAPSULE | Freq: Every day | ORAL | Status: DC
  Administered 2019-05-10: 08:00:00 200 mg via ORAL
  Filled 2019-05-08 (×2): qty 2

## 2019-05-08 MED ORDER — OXYCODONE HCL 5 MG PO TABS
5.0000 mg | ORAL_TABLET | ORAL | Status: DC | PRN
  Administered 2019-05-08 – 2019-05-09 (×2): 10 mg via ORAL
  Filled 2019-05-08 (×2): qty 2

## 2019-05-08 MED ORDER — PANTOPRAZOLE SODIUM 40 MG PO TBEC
40.0000 mg | DELAYED_RELEASE_TABLET | Freq: Every day | ORAL | Status: DC
  Administered 2019-05-09 – 2019-05-11 (×3): 40 mg via ORAL
  Filled 2019-05-08 (×3): qty 1

## 2019-05-08 MED ORDER — BISACODYL 5 MG PO TBEC: 10.0000 mg | DELAYED_RELEASE_TABLET | Freq: Every day | ORAL | Status: DC | PRN

## 2019-05-08 MED ORDER — SODIUM CHLORIDE 0.9 % IV SOLN: 250.0000 mL | INTRAVENOUS | Status: DC | PRN

## 2019-05-08 MED ORDER — MOVING RIGHT ALONG BOOK
Freq: Once | Status: DC
  Filled 2019-05-08: qty 1

## 2019-05-08 MED ORDER — ONDANSETRON HCL 4 MG/2ML IJ SOLN: 4.0000 mg | Freq: Four times a day (QID) | INTRAMUSCULAR | Status: DC | PRN

## 2019-05-08 MED ORDER — FUROSEMIDE 40 MG PO TABS
40.0000 mg | ORAL_TABLET | Freq: Every day | ORAL | Status: DC
  Administered 2019-05-09 – 2019-05-10 (×2): 40 mg via ORAL
  Filled 2019-05-08 (×2): qty 1

## 2019-05-08 MED ORDER — ACETAMINOPHEN 325 MG PO TABS
650.0000 mg | ORAL_TABLET | Freq: Four times a day (QID) | ORAL | Status: DC | PRN
  Administered 2019-05-08 – 2019-05-10 (×3): 650 mg via ORAL
  Filled 2019-05-08 (×3): qty 2

## 2019-05-08 MED ORDER — POTASSIUM CHLORIDE CRYS ER 20 MEQ PO TBCR
20.0000 meq | EXTENDED_RELEASE_TABLET | Freq: Two times a day (BID) | ORAL | Status: DC
  Administered 2019-05-08 – 2019-05-10 (×5): 20 meq via ORAL
  Filled 2019-05-08 (×5): qty 1

## 2019-05-08 MED ORDER — TRAMADOL HCL 50 MG PO TABS
50.0000 mg | ORAL_TABLET | Freq: Four times a day (QID) | ORAL | Status: DC | PRN
  Administered 2019-05-09 – 2019-05-11 (×3): 50 mg via ORAL
  Filled 2019-05-08 (×3): qty 1

## 2019-05-08 MED ORDER — METOPROLOL TARTRATE 12.5 MG HALF TABLET
12.5000 mg | ORAL_TABLET | Freq: Two times a day (BID) | ORAL | Status: DC
  Administered 2019-05-08: 12.5 mg via ORAL
  Filled 2019-05-08: qty 1

## 2019-05-08 MED ORDER — ONDANSETRON HCL 4 MG PO TABS: 4.0000 mg | ORAL_TABLET | Freq: Four times a day (QID) | ORAL | Status: DC | PRN

## 2019-05-08 NOTE — Progress Notes (Signed)
Spoke with RN re d/c CVC order.  RN aware. 

## 2019-05-08 NOTE — Progress Notes (Signed)
Transferred from 2H at 220 pm.  CCMD notified x 2 - tele box [id number] , pt oriented to room.  R IJ bandage replaced.  Vital signs completed.  Pacing wires present.

## 2019-05-09 DIAGNOSIS — Z951 Presence of aortocoronary bypass graft: Secondary | ICD-10-CM

## 2019-05-09 MED ORDER — METOPROLOL TARTRATE 25 MG PO TABS
25.0000 mg | ORAL_TABLET | Freq: Two times a day (BID) | ORAL | Status: DC
  Administered 2019-05-09 (×2): 25 mg via ORAL
  Filled 2019-05-09 (×2): qty 1

## 2019-05-09 NOTE — Progress Notes (Signed)
Patient ambulated in hallway with nursing staff and rolling walker. Patient ambulated 500 feet. Tolerated well and gait steady. Walked on room air. Back in room assisted to bed. Will monitor patient. Jerl Munyan, Randall An rN

## 2019-05-09 NOTE — Discharge Summary (Addendum)
Physician Discharge Summary  Patient ID: Edward Lawrence MRN: [id number] DOB/AGE: May 01, 1971 48 y.o.  Admit date: 05/04/2019 Discharge date: 05/11/2019  Admission Diagnoses: Unstable angina  Discharge Diagnoses:  Principal Problem:   Chest pain Active Problems:   Hypertension   High cholesterol   Hyperglycemia   Unstable angina (HCC)   Hypokalemia   Hx of CABG   Patient Active Problem List   Diagnosis Date Noted  . Hx of CABG 05/06/2019  . Unstable angina ([place]) 05/05/2019  . Hypokalemia 05/05/2019  . Chest pain 05/04/2019  . Hyperglycemia 05/04/2019  . Hypertension   . High cholesterol    History of Present Illness: at time of consultation  The patient is a 48 year old male who presented to the emergency department yesterday where he presented with chest pain.  The patient has had intermittent episodes of chest pain that would radiate down both arms for the past 3 weeks.  The pain is described as pressure and fairly classically as if somebody is sitting on his chest.  He initially managed with antacids as he thought it may be related to indigestion and would occur after eating.  This resulted in no significant improvement.  The patient also did try to walk after eating but continued to have some pain.  The pain would stop at approximately 10 minutes after he would rest but would quickly resume when he started to walk again.  He notes that there is been increasing frequency and intensity in these episodes over the past 3 weeks.  Yesterday he had an episode while driving with a sudden onset of chest pain with radiation down his left arm.  The left arm developed numbness and also felt very weak.  He also noted some tingling in his cheeks with pain.  The patient does have a history of hypertension and hyperlipidemia but no previous history of known cardiac disease.  He has noted some increasing shortness of breath when he and his wife walk in the evening.  He does den, nausea, vomiting or  syncope.  Due to the intensity of this episode he did present to the emergency department for further evaluation.  He was noted to be hypertensive with stable vital signs and EKG showed some inferior Q waves with ST depression in the anterior leads.  His initial troponin was negative.   Repeat troponins were mildly elevated with peak of 0.10.  Cardiology consultation was obtained and was felt that he should undergo diagnostic cardiac catheterization which was done on today's date.  Please see findings below.  He is noted to have severe proximal LAD lesion as well as severe three-vessel disease including a chronically occluded mid left circumflex.  His left ventricular systolic function is normal.  His LV EDP pressure is normal.  Ejection fraction is 55 to 65% by visual estimate.  Cardiology has recommended urgent CABG given the severity and complexity of the disease and we are asked to see in cardiothoracic surgical consultation.  His hemoglobin A1c is noted to be normal at 5.2.  His mean plasma glucose is 102.54.  His serum TSH is in the normal range.  Renal function is in the normal range with a GFR greater than 60.  He is not anemic.  He does have a somewhat poor triglyceride to HDL ratio and LDL C is 101.  We do not have a particle breakdown.   Discharged Condition: good  Hospital Course: The patient was admitted for evaluation of crescendo angina and cardiac catheterization revealed severe coronary  artery disease as described.  Cardiothoracic surgical consultation was obtained with Arvid Right, MD who evaluated the patient and studies and agree with recommendations to proceed with CABG.  On 05/06/2019 he was taken to the operating room where he underwent the below described procedure.  He tolerated it well and was taken to the surgical intensive care unit in stable condition.  Postoperative hospital course: The patient has done well.  He has maintained stable hemodynamics.  He was weaned from the  ventilator without difficulty using standard protocols.  All routine lines, monitors and drainage devices have been discontinued in the standard fashion.  He has some mild volume overload but is responding well to diuretics.  He has had mild expected acute blood loss anemia which is stable.  Renal function has remained within normal limits.  Blood sugars have been under good control.  Oxygen has been weaned and he maintains good saturations on room air.  Incisions are noted to be healing well without evidence of infection.  He is tolerating gradually increasing activities using standard cardiac rehab protocols.  He was tachycardic so Lopressor was increased to 50 mg bid. Epicardial pacing wires were removed on 05/11. Patient had complaints of amber colored urine on 05/11. He stated there was some diffiuclty in remove foley over the weekend. UA showed moderate Hgb and was strawberry colored. He has no flank tenderness or evidence of UTI. Last WBC on 05/09 down to 14,200. Patient was instructed how to remove chest tube sutures and given suture removal kit. He was instructed to remove them on Monday 05/18.  Dr. Cyndia Bent saw and evaluated the patient this am. He did have a fever to 101. He has no evidence of wound infection, no UTI (per UA);per Dr. Cyndia Bent, fever may be related to atelectasis.  Dr. Cyndia Bent also recommended Amlodipine and Hyzaar will be resumed at discharge for better BP control. Patient is felt surgically stable for discharge today.  Consults: cardiology  Significant Diagnostic Studies: angiography: cardiac catheterization  Treatments: surgery:                                                                                       CARDIOVASCULAR SURGERY OPERATIVE NOTE  05/06/2019  Surgeon:  Gaye Pollack, MD  First Assistant: Jadene Pierini,  PA-C   Preoperative Diagnosis:  Severe multi-vessel coronary artery disease   Postoperative Diagnosis:  Same   Procedure:  1. Median  Sternotomy 2. Extracorporeal circulation 3.   Coronary artery bypass grafting x 3   Left internal mammary artery to the LAD  Free right internal mammary artery to the diagonal 1  SVG to distal RCA  4.   Endoscopic vein harvest from the right leg   Anesthesia:  General Endotracheal Discharge Exam: Blood pressure 137/83, pulse (!) 108, temperature 97.9 F (36.6 C), temperature source Oral, resp. rate (!) 28, height 5' 10"  (1.778 m), weight 93.1 kg, SpO2 98 %.  General appearance: alert and cooperative Neurologic: intact Heart: regular rate and rhythm, S1, S2 normal, no murmur, click, rub or gallop Lungs: clear to auscultation bilaterally Extremities: extremities normal, atraumatic, no cyanosis or edema Wound: incisions ok   Disposition:  Discharge disposition: 01-Home or Self Care       Discharge Instructions    Amb Referral to Cardiac Rehabilitation   Complete by:  As directed    Referring to [place] CRP 2   Diagnosis:   CABG NSTEMI     CABG X ___:  3     Allergies as of 05/11/2019      Reactions   Azithromycin Other (See Comments)   Spikes BP      Medication List    STOP taking these medications   atenolol 100 MG tablet Commonly known as:  TENORMIN     TAKE these medications   acetaminophen 325 MG tablet Commonly known as:  TYLENOL Take 2 tablets (650 mg total) by mouth every 6 (six) hours as needed for mild pain.   amLODipine 10 MG tablet Commonly known as:  NORVASC Take 10 mg by mouth daily.   aspirin 325 MG EC tablet Take 1 tablet (325 mg total) by mouth daily.   atorvastatin 80 MG tablet Commonly known as:  LIPITOR Take 1 tablet (80 mg total) by mouth every morning. What changed:    medication strength  how much to take   cyclobenzaprine 10 MG tablet Commonly known as:  FLEXERIL Take 10 mg by mouth 3 (three) times daily as needed for muscle spasms.   fluticasone 50 MCG/ACT nasal spray Commonly known as:  FLONASE Place 1 spray  into both nostrils daily.   furosemide 40 MG tablet Commonly known as:  Lasix Take 1 tablet (40 mg total) by mouth daily. For 5 days then stop   losartan-hydrochlorothiazide 100-12.5 MG tablet Commonly known as:  HYZAAR Take 1 tablet by mouth daily.   magnesium oxide 400 MG tablet Commonly known as:  MAG-OX Take 400 mg by mouth daily.   metoprolol tartrate 50 MG tablet Commonly known as:  LOPRESSOR Take 1 tablet (50 mg total) by mouth 2 (two) times daily.   Omega-3 Fish Oil Concentrate 1000 MG Cpdr Take 1,000 mg by mouth daily.   Potassium Chloride ER 20 MEQ Tbcr Take 20 mEq by mouth daily. For 5 days then stop   testosterone 50 MG/5GM (1%) Gel Commonly known as:  ANDROGEL Place 5 g onto the skin daily.   traMADol 50 MG tablet Commonly known as:  ULTRAM Take 1 tablet (50 mg total) by mouth every 6 (six) hours as needed for moderate pain.     The patient has been discharged on:   1.Beta Blocker:  Yes [ x  ]                              No   [   ]                              If No, reason:  2.Ace Inhibitor/ARB: Yes [ x  ]                                     No  [    ]                                     If No, reason:  3.Statin:   Yes [  x ]  No  [   ]                  If No, reason:  4.Ecasa:  Yes  [ x  ]                  No   [   ]                  If No, reason:   Follow-up Information    Gaye Pollack, MD. Go on 06/07/2019.   Specialty:  Cardiothoracic Surgery Why:  PA/LAT CXR to be taken (at [facility] which is in the same building as Dr. Vivi Martens office) on 06/08 at 12:30 pm; Appointment time is at 1:00 pm Contact information: [place] [address] [phone]        [place], [place], PA-C Follow up on 05/27/2019.   Specialties:  Cardiology, Radiology Why:  This appointment is virtual (do NOT go to office, via telephone/computer). Appointment time is at 9:30 am Contact information: [place] [address] [phone]        Patient Follow up.   Why:  Please remove all chest tube sutures on Monday 05/18.          Signed: Nani Skillern PA-C 05/11/2019, 9:05 AM

## 2019-05-09 NOTE — Progress Notes (Signed)
Patient ambulated in hallway with rolling walker independently. Will monitor patient. Junie Engram, Randall An rN

## 2019-05-09 NOTE — Progress Notes (Signed)
Patient ambulated in hallway with rolling walker independently gait steady. Will monitor patient. Alecia Doi, Randall An RN

## 2019-05-09 NOTE — Progress Notes (Addendum)
[address]       [place] [address]             [phone]      3 Days Post-Op Procedure(s) (LRB): CORONARY ARTERY BYPASS GRAFTING (CABG) times three using bilateral mammary artery and right saphenous vein harvested with endoscope. (N/A) TRANSESOPHAGEAL ECHOCARDIOGRAM (TEE) (N/A) Subjective: Feels ok , got some sleep which made him feel much better  Objective: Vital signs in last 24 hours: Temp:  [98 F (36.7 C)-99.8 F (37.7 C)] 98 F (36.7 C) (05/10 0505) Pulse Rate:  [84-108] 107 (05/10 0505) Cardiac Rhythm: Sinus tachycardia (05/10 0700) Resp:  [20-34] 34 (05/10 0517) BP: (112-137)/(63-90) 133/90 (05/10 0505) SpO2:  [93 %-100 %] 93 % (05/10 0505) Weight:  [96 kg] 96 kg (05/10 0517)  Hemodynamic parameters for last 24 hours:    Intake/Output from previous day: 05/09 0701 - 05/10 0700 In: 120 [P.O.:120] Out: 925 [Urine:925] Intake/Output this shift: No intake/output data recorded.  General appearance: alert, cooperative and no distress Heart: regular rate and rhythm and tachy Lungs: min dim in left base, o/w clear Abdomen: benign Extremities: trace edema Wound: incis healing well(evh), chest dressing with min old bloody drainage  Lab Results: Recent Labs    05/07/19 1713 05/08/19 0418  WBC 18.7* 14.2*  HGB 11.0* 9.7*  HCT 34.6* 30.7*  PLT 199 143*   BMET:  Recent Labs    05/07/19 1713 05/08/19 0418  NA 138 139  K 4.2 3.6  CL 107 105  CO2 24 25  GLUCOSE 169* 125*  BUN 19 12  CREATININE 1.07 0.88  CALCIUM 8.3* 7.8*    PT/INR:  Recent Labs    05/06/19 1420  LABPROT 16.4*  INR 1.3*   ABG    Component Value Date/Time   PHART 7.315 (L) 05/06/2019 1803   HCO3 20.6 05/06/2019 1803   TCO2 22 05/06/2019 1803   ACIDBASEDEF 5.0 (H) 05/06/2019 1803   O2SAT 97.0 05/06/2019 1803   CBG (last 3)  Recent Labs    05/07/19 2321 05/08/19 0408 05/08/19 0807  GLUCAP 113* 118* 135*    Meds Scheduled Meds: . aspirin EC   325 mg Oral Daily  . atorvastatin  80 mg Oral q1800  . docusate sodium  200 mg Oral Daily  . enoxaparin (LOVENOX) injection  40 mg Subcutaneous QHS  . furosemide  40 mg Oral Daily  . mouth rinse  15 mL Mouth Rinse BID  . metoprolol tartrate  12.5 mg Oral BID  . moving right along book   Does not apply Once  . mupirocin ointment  1 application Nasal BID  . pantoprazole  40 mg Oral QAC breakfast  . potassium chloride  20 mEq Oral BID  . sodium chloride flush  3 mL Intravenous Q12H   Continuous Infusions: . sodium chloride     PRN Meds:.sodium chloride, acetaminophen, bisacodyl **OR** bisacodyl, ondansetron **OR** ondansetron (ZOFRAN) IV, oxyCODONE, sodium chloride flush, traMADol  Xrays Dg Chest Port 1 View  Result Date: 05/08/2019 CLINICAL DATA:  CABG EXAM: PORTABLE CHEST 1 VIEW COMPARISON:  05/07/2019 FINDINGS: Sternotomy wires overlie normal cardiac silhouette. Interval retraction of Swan-Ganz catheter. Removal of LEFT and RIGHT chest tubes without pneumothorax. Mediastinal drain removed. Improved aeration lung bases. No pneumothorax. Persistent mild RIGHT basilar atelectasis. IMPRESSION: 1. Improved bibasilar atelectasis. 2. Chest tubes removed without pneumothorax. Electronically Signed   By: Genevive Bi M.D.   On: 05/08/2019 07:27    Assessment/Plan: S/P Procedure(s) (  LRB): CORONARY ARTERY BYPASS GRAFTING (CABG) times three using bilateral mammary artery and right saphenous vein harvested with endoscope. (N/A) TRANSESOPHAGEAL ECHOCARDIOGRAM (TEE) (N/A)  1 doing very well 2 hemodyn stable except tachy, will increase beta blocker dose, BP food- no ACE-I/ARB for now 3 sats good on RA, cont routine rehab/pulm toilet 4 no new labs 5 mild volume overload, cont diuretic for now 6 BS adeq controlled, normal HgA1C(5.2)   LOS: 4 days    Rowe ClackWayne E Gold [facility] 05/09/2019 Pager [phone]   Chart reviewed, patient examined, agree with above. Sinus tachy 120 at rest. He was on  Atenolol 100 preop. Lopressor increased to 25 bid today and would titrate up as needed to decreased HR.

## 2019-05-09 NOTE — Discharge Instructions (Signed)
Endoscopic Saphenous Vein Harvesting, Care After Refer to this sheet in the next few weeks. These instructions provide you with information about caring for yourself after your procedure. Your health care provider may also give you more specific instructions. Your treatment has been planned according to current medical practices, but problems sometimes occur. Call your health care provider if you have any problems or questions after your procedure. What can I expect after the procedure? After the procedure, it is common to have:  Pain.  Bruising.  Swelling.  Numbness. Follow these instructions at home: Medicine  Take over-the-counter and prescription medicines only as told by your health care provider.  Do not drive or operate heavy machinery while taking prescription pain medicine. Incision care   Follow instructions from your health care provider about how to take care of the cut made during surgery (incision). Make sure you: ? Wash your hands with soap and water before you change your bandage (dressing). If soap and water are not available, use hand sanitizer. ? Change your dressing as told by your health care provider. ? Leave stitches (sutures), skin glue, or adhesive strips in place. These skin closures may need to be in place for 2 weeks or longer. If adhesive strip edges start to loosen and curl up, you may trim the loose edges. Do not remove adhesive strips completely unless your health care provider tells you to do that.  Check your incision area every day for signs of infection. Check for: ? More redness, swelling, or pain. ? More fluid or blood. ? Warmth. ? Pus or a bad smell. General instructions  Raise (elevate) your legs above the level of your heart while you are sitting or lying down.  Do any exercises your health care providers have given you. These may include deep breathing, coughing, and walking exercises.  Do not shower, take baths, swim, or use a hot tub  unless told by your health care provider.  Wear your elastic stocking if told by your health care provider.  Keep all follow-up visits as told by your health care provider. This is important. Contact a health care provider if:  Medicine does not help your pain.  Your pain gets worse.  You have new leg bruises or your leg bruises get bigger.  You have a fever.  Your leg feels numb.  You have more redness, swelling, or pain around your incision.  You have more fluid or blood coming from your incision.  Your incision feels warm to the touch.  You have pus or a bad smell coming from your incision. Get help right away if:  Your pain is severe.  You develop pain, tenderness, warmth, redness, or swelling in any part of your leg.  You have chest pain.  You have trouble breathing. This information is not intended to replace advice given to you by your health care provider. Make sure you discuss any questions you have with your health care provider. Document Released: 08/28/2011 Document Revised: 05/23/2016 Document Reviewed: 10/30/2015 Elsevier Interactive Patient Education  2019 [employer] Coronary Artery Bypass Grafting, Care After This sheet gives you information about how to care for yourself after your procedure. Your doctor may also give you more specific instructions. If you have problems or questions, contact your doctor. Follow these instructions at home: Medicines  Take over-the-counter and prescription medicines only as told by your doctor. Do not stop taking medicines or start any new medicines unless your doctor says it is okay.  If you  were prescribed an antibiotic medicine, take it as told by your doctor. Do not stop taking the antibiotic even if you start to feel better.  Do not drive or use heavy machinery while taking prescription pain medicine. Incision care      Follow instructions from your doctor about how to take care of your incisions. Make sure  you: ? Wash your hands with soap and water before you change your bandage (dressing). If you cannot use soap and water, use hand sanitizer. ? Change your dressing as told by your doctor. ? Leave stitches (sutures), skin glue, or skin tape (adhesive) strips in place. They may need to stay in place for 2 weeks or longer. If tape strips get loose and curl up, you may trim the loose edges. Do not remove tape strips completely unless your doctor says it is okay.  Make sure the incisions are clean, dry, and protected.  Check your incision areas every day for signs of infection. Check for: ? Redness, swelling, or pain. ? Fluid or blood. ? Warmth. ? Pus or a bad smell.  If cuts were made in your legs: ? Avoid crossing your legs. ? Avoid sitting for long periods of time. Change positions every 30 minutes. ? Raise (elevate) your legs when you are sitting. Bathing  Do not take baths, swim, or use a hot tub until your doctor says it is okay.  Only take sponge baths. Pat the incisions dry. Do not rub the incisions to dry.  You may shower with ordinary soap and water Eating and drinking  Eat foods that are high in fiber, such as raw fruits and vegetables, whole grains, beans, and nuts. Meats should be lean cut. Avoid canned, processed, and fried foods. This can help prevent constipation. This is also a recommended part of a heart-healthy diet.  Drink enough fluid to keep your urine clear or pale yellow.  Limit alcohol intake to no more than 1 drink a day for nonpregnant women and 2 drinks a day for men. One drink equals 12 oz of beer, 5 oz of wine, or 1 oz of hard liquor. Activity  Rest and limit your activity as told by your doctor. You may be told to: ? Stop any activity right away if you have chest pain, shortness of breath, irregular heartbeats, or dizziness. Get help right away if you have any of these symptoms. ? Move around often for short periods or take short walks as told by your  doctor. Slowly increase your activities. You may need physical therapy or cardiac rehabilitation. ? Avoid lifting, pushing, or pulling anything that is heavier than 10 lb (4.5 kg) for at least 6 weeks or as told by your doctor.  Do not drive until your doctor says it is okay.  Ask your doctor when you can go back to work.  Ask your doctor when you can be sexually active. General instructions   Do not use any products that contain nicotine or tobacco, such as cigarettes and e-cigarettes. If you need help quitting, ask your doctor.  Take 2-3 deep breaths every few hours during the day while you get better. This helps expand your lungs and prevent complications.  If you were given a device called an incentive spirometer, use it several times a day to practice deep breathing. Support your chest with a pillow or your arms when you take deep breaths or cough.  Wear compression stockings as told by your doctor. These stockings help to prevent blood  clots and reduce swelling in your legs.  Weigh yourself every day. This helps to see if your body is holding (retaining) fluid that may make your heart and lungs work harder.  Keep all follow-up visits as told by your doctor. This is important. Contact a doctor if:  You have more redness, swelling, or pain around any cut.  You have more fluid or blood coming from any cut.  Any cut feels warm to the touch.  You have pus or a bad smell coming from any cut.  You have a fever.  You have swelling in your ankles or legs.  You have pain in your legs.  You gain 2 or more pounds (0.9 kg) a day.  You feel sick to your stomach (nauseous) or throw up (vomit).  You have watery poop (diarrhea). Get help right away if:  You have chest pain that goes to your jaw or arms.  You are short of breath.  You have a fast or irregular heartbeat.  You notice a "clicking" in your breastbone (sternum) when you move.  You feel numb or weak in your arms or  legs.  You feel dizzy or light-headed. Summary  After the procedure, it is common to have pain or discomfort in the incision areas.  Do not take baths, swim, or use a hot tub until your health care provider approves.  Slowly increase your activities. You may need physical therapy or cardiac rehabilitation.  Weigh yourself every day. This helps to see if your body is holding (retaining) fluid that may make your heart and lungs work harder. This information is not intended to replace advice given to you by your health care provider. Make sure you discuss any questions you have with your health care provider. Document Released: 12/21/2013 Document Revised: 01/28/2017 Document Reviewed: 01/28/2017 Elsevier Interactive Patient Education  2019 [employer]

## 2019-05-10 DIAGNOSIS — I25110 Atherosclerotic heart disease of native coronary artery with unstable angina pectoris: Secondary | ICD-10-CM

## 2019-05-10 LAB — URINALYSIS, ROUTINE W REFLEX MICROSCOPIC
Bacteria, UA: NONE SEEN
Bilirubin Urine: NEGATIVE
Glucose, UA: NEGATIVE mg/dL
Ketones, ur: NEGATIVE mg/dL
Leukocytes,Ua: NEGATIVE
Nitrite: NEGATIVE
Protein, ur: NEGATIVE mg/dL
Specific Gravity, Urine: 1.008 (ref 1.005–1.030)
pH: 8 (ref 5.0–8.0)

## 2019-05-10 MED ORDER — METOPROLOL TARTRATE 50 MG PO TABS
50.0000 mg | ORAL_TABLET | Freq: Two times a day (BID) | ORAL | Status: DC
  Administered 2019-05-10 – 2019-05-11 (×3): 50 mg via ORAL
  Filled 2019-05-10 (×3): qty 1

## 2019-05-10 MED FILL — Mannitol IV Soln 20%: INTRAVENOUS | Qty: 500 | Status: AC

## 2019-05-10 MED FILL — Heparin Sodium (Porcine) Inj 1000 Unit/ML: INTRAMUSCULAR | Qty: 10 | Status: AC

## 2019-05-10 MED FILL — Calcium Chloride Inj 10%: INTRAVENOUS | Qty: 10 | Status: AC

## 2019-05-10 MED FILL — Sodium Bicarbonate IV Soln 8.4%: INTRAVENOUS | Qty: 50 | Status: AC

## 2019-05-10 MED FILL — Lidocaine HCl(Cardiac) IV PF Soln Pref Syr 100 MG/5ML (2%): INTRAVENOUS | Qty: 5 | Status: AC

## 2019-05-10 MED FILL — Electrolyte-R (PH 7.4) Solution: INTRAVENOUS | Qty: 3000 | Status: AC

## 2019-05-10 NOTE — Progress Notes (Signed)
Cardiac Rehab Advisory Cardiac Rehab Phase I is not seeing pts face to face at this time due to Covid 19 restrictions. Ambulation is occurring through nursing, PT, and mobility teams. We will help facilitate that process as needed. We are calling pts in their rooms and discussing education. We will then deliver education materials to pts RN for delivery to pt. '  1035-1112 Called pt to encourage walking and IS. Pt has been walking with walker in hallway increasing distance as he can and also using IS. Pt does not think he will need walker for home. Discussed with pt staying in the tube and sternal precautions, IS, walking for ex, heart healthy food choices, wound care and CRP 2. Encouraged pt to watch discharge video. Discussed CRP 2 and referring to [place] CRP 2. Will give diet and written ex to RN to give to pt. Pt voiced understanding of ed. Luetta Nutting RN BSN 05/10/2019 11:12 AM

## 2019-05-10 NOTE — Progress Notes (Signed)
Patient EPW pulled per protocol and as ordered all ends intact. Slight resistance met with Rt. Atrial wire. No oozing noted at this time. Patient BP 137/87 heart rate 91 on monitor. Will continue to monitor patient. Deana Krock, Bettina Gavia rN

## 2019-05-10 NOTE — Progress Notes (Signed)
Patient ambulated in hallway independently will monitor patient. Harriett Azar, Randall An rN

## 2019-05-10 NOTE — Progress Notes (Addendum)
      [address]       [place] [address]             [phone]        4 Days Post-Op Procedure(s) (LRB): CORONARY ARTERY BYPASS GRAFTING (CABG) times three using bilateral mammary artery and right saphenous vein harvested with endoscope. (N/A) TRANSESOPHAGEAL ECHOCARDIOGRAM (TEE) (N/A)  Subjective: Patient has complaints of "amber or orange urine".  Objective: Vital signs in last 24 hours: Temp:  [97.9 F (36.6 C)-100 F (37.8 C)] 99.1 F (37.3 C) (05/11 0517) Pulse Rate:  [39-123] 101 (05/11 0517) Cardiac Rhythm: Sinus tachycardia (05/11 0612) Resp:  [20-25] 22 (05/11 0527) BP: (116-141)/(78-97) 141/97 (05/11 0517) SpO2:  [90 %-97 %] 97 % (05/11 0517) Weight:  [94.1 kg] 94.1 kg (05/11 0527)  Pre op weight 91.6 kg Current Weight  05/10/19 94.1 kg       Intake/Output from previous day: 05/10 0701 - 05/11 0700 In: 600 [P.O.:600] Out: -    Physical Exam:  Cardiovascular: Sinus tachycardia Pulmonary: Clear to auscultation bilaterally Abdomen: Soft, non tender, bowel sounds present. Extremities: Mild bilateral lower extremity edema. Wounds: Clean and dry.  No erythema or signs of infection.  Lab Results: CBC: Recent Labs    05/07/19 1713 05/08/19 0418  WBC 18.7* 14.2*  HGB 11.0* 9.7*  HCT 34.6* 30.7*  PLT 199 143*   BMET:  Recent Labs    05/07/19 1713 05/08/19 0418  NA 138 139  K 4.2 3.6  CL 107 105  CO2 24 25  GLUCOSE 169* 125*  BUN 19 12  CREATININE 1.07 0.88  CALCIUM 8.3* 7.8*    PT/INR:  Lab Results  Component Value Date   INR 1.3 (H) 05/06/2019   INR 1.0 05/05/2019   ABG:  INR: Will add last result for INR, ABG once components are confirmed Will add last 4 CBG results once components are confirmed  Assessment/Plan:  1. CV - ST at times. On Lopressor 25 mg bid but will increase for better HR and BP control. 2.  Pulmonary - On room air 3. Volume Overload - On Lasix 40 mg daily 4.  Acute blood loss anemia -  Last H and H  9.7 and 30.7 5. Remove EPW 6. Will check UA at patient request-likely some hematuria from foley removal over the weekend 7. Hopefully, home in am  Lelon Huh [facility] 05/10/2019,7:11 AM [phone]   Chart reviewed, patient examined, agree with above. He feels well and is ambulating independently.  Still sinus tachy so Lopressor increased to 50 bid. I think Lopressor is a better beta blocker for him than the Atenolol he was on before. Plan home tomorrow if no changes.

## 2019-05-10 NOTE — Progress Notes (Signed)
Pt c/o chills and being sweaty. Last temp was 99.3. Rechecked temp and it was 101. Administered 650 mg of PRN Tylenol.

## 2019-05-10 NOTE — Progress Notes (Signed)
Progress Note  Patient Name: Edward Lawrence Date of Encounter: 05/10/2019  Primary Cardiologist: Dr. Allyson Sabal (new)  Subjective   Doing well overall. Reviewed recommendations post CABG, see below. All questions answered. Denies any pleuritic chest pain, has mild xyphoid tenderness with ambulating and back pain from the hospital bed.  Inpatient Medications    Scheduled Meds: . aspirin EC  325 mg Oral Daily  . atorvastatin  80 mg Oral q1800  . docusate sodium  200 mg Oral Daily  . enoxaparin (LOVENOX) injection  40 mg Subcutaneous QHS  . furosemide  40 mg Oral Daily  . mouth rinse  15 mL Mouth Rinse BID  . metoprolol tartrate  50 mg Oral BID  . moving right along book   Does not apply Once  . mupirocin ointment  1 application Nasal BID  . pantoprazole  40 mg Oral QAC breakfast  . potassium chloride  20 mEq Oral BID  . sodium chloride flush  3 mL Intravenous Q12H   Continuous Infusions: . sodium chloride     PRN Meds: sodium chloride, acetaminophen, bisacodyl **OR** bisacodyl, ondansetron **OR** ondansetron (ZOFRAN) IV, oxyCODONE, sodium chloride flush, traMADol   Vital Signs    Vitals:   05/10/19 1038 05/10/19 1045 05/10/19 1100 05/10/19 1137  BP: 127/84 127/88 128/87 130/89  Pulse: 87 90 90 92  Resp: (!) 23 20 (!) 23 19  Temp:      TempSrc:      SpO2: 97% 97% 97% 100%  Weight:      Height:        Intake/Output Summary (Last 24 hours) at 05/10/2019 1734 Last data filed at 05/10/2019 0755 Gross per 24 hour  Intake 462 ml  Output -  Net 462 ml   Last 3 Weights 05/10/2019 05/09/2019 05/08/2019  Weight (lbs) 207 lb 7.3 oz 211 lb 10.3 oz 214 lb 4.6 oz  Weight (kg) 94.1 kg 96 kg 97.2 kg      Telemetry    Sinus rhythm/sinus tach in the 90s-low 100s - Personally Reviewed  ECG    Sinus with PR depression and resolving ST changes c/w mild pericarditis changes - Personally Reviewed  Physical Exam   GEN: No acute distress.   Neck: No JVD Cardiac: RRR, no murmurs, rubs,  or gallops.  Respiratory: Clear to auscultation bilaterally. GI: Soft, nontender, non-distended  MS: No edema; No deformity. Neuro:  Nonfocal  Psych: Normal affect   Labs    Chemistry Recent Labs  Lab 05/07/19 0354 05/07/19 1713 05/08/19 0418  NA 138 138 139  K 3.7 4.2 3.6  CL 104 107 105  CO2 22 24 25   GLUCOSE 137* 169* 125*  BUN 14 19 12   CREATININE 0.89 1.07 0.88  CALCIUM 7.8* 8.3* 7.8*  GFRNONAA >60 >60 >60  GFRAA >60 >60 >60  ANIONGAP 12 7 9      Hematology Recent Labs  Lab 05/07/19 0354 05/07/19 1713 05/08/19 0418  WBC 17.1* 18.7* 14.2*  RBC 4.21* 4.18* 3.72*  HGB 11.1* 11.0* 9.7*  HCT 34.5* 34.6* 30.7*  MCV 81.9 82.8 82.5  MCH 26.4 26.3 26.1  MCHC 32.2 31.8 31.6  RDW 13.3 13.6 13.6  PLT 205 199 143*    Cardiac Enzymes Recent Labs  Lab 05/04/19 1015 05/04/19 1528 05/04/19 2105 05/05/19 0325  TROPONINI <0.03 0.10* 0.09* 0.06*   No results for input(s): TROPIPOC in the last 168 hours.   BNPNo results for input(s): BNP, PROBNP in the last 168 hours.   DDimer No  results for input(s): DDIMER in the last 168 hours.   Radiology    No results found.  Cardiac Studies   Echo and cath from 05/05/19 personally reviewed  Patient Profile     48 y.o. male with PMH HTN and HLD who presented with chest pain on 05/04/19. Underwent cath which showed 3V disease, including 99% proximal LAD. He underwent 3V CABG by Dr. Laneta SimmersBartle on 05/06/19  Assessment & Plan   Severe premature CAD s/p 3V CABG (Left internal mammary artery to the LAD, free right internal mammary artery to the diagonal 1, SVG to distal RCA) on 05/06/19 by Dr. Laneta SimmersBartle -on aspirin 325 mg daily post CABG -on atorvastatin 80 mg daily (in place of 10 mg home dose) -on metoprolol tartrate 50 mg BID (in place of home atenolol) -home amlodipine, losartan-HCTZ on hold -cardiac rehab ordered. He plans to walk at home and around a local track until this can be started. -preserved EF on echo -given his early  onset, recommend his two children be screened. If lipid profile unremarkable, would consider lp(a) testing.  CARDIOLOGY RECOMMENDATIONS:  Discharge is anticipated in the next 48 hours. Recommendations for medications and follow up:  Discharge Medications: [facility] Exceptions to the above:  Would continue furosemide for 3 days post discharge, then PRN for swelling/weight gain. Stop potassium supplement when furosemide stopped.  Instructed to call the office if his home blood pressure rises routinely >140 systolic; otherwise hold on restarted amlodipine, HCTZ and lisinopril  Follow Up: The patient's Primary Cardiologist is Dr. Allyson SabalBerry  Follow up in the office in 2 week(s).  Signed,  Jodelle RedBridgette Ruble Pumphrey, MD  5:43 PM 05/10/2019  [facility]  For questions or updates, please contact [facility] Please consult [email or web address] for contact info under

## 2019-05-11 MED ORDER — ATORVASTATIN CALCIUM 80 MG PO TABS: 80.0000 mg | ORAL_TABLET | ORAL | 0 refills | Status: DC

## 2019-05-11 MED ORDER — FUROSEMIDE 40 MG PO TABS: 40.0000 mg | ORAL_TABLET | Freq: Every day | ORAL | 0 refills | Status: DC

## 2019-05-11 MED ORDER — METOPROLOL TARTRATE 50 MG PO TABS: 50.0000 mg | ORAL_TABLET | Freq: Two times a day (BID) | ORAL | 1 refills | Status: DC

## 2019-05-11 MED ORDER — ASPIRIN 325 MG PO TBEC: 325.0000 mg | DELAYED_RELEASE_TABLET | Freq: Every day | ORAL | 0 refills | Status: AC

## 2019-05-11 MED ORDER — AMLODIPINE BESYLATE 10 MG PO TABS
10.0000 mg | ORAL_TABLET | Freq: Every day | ORAL | Status: DC
  Administered 2019-05-11: 10 mg via ORAL
  Filled 2019-05-11: qty 1

## 2019-05-11 MED ORDER — ACETAMINOPHEN 325 MG PO TABS: 650.0000 mg | ORAL_TABLET | Freq: Four times a day (QID) | ORAL | Status: DC | PRN

## 2019-05-11 MED ORDER — POTASSIUM CHLORIDE ER 20 MEQ PO TBCR: 20.0000 meq | EXTENDED_RELEASE_TABLET | Freq: Every day | ORAL | 0 refills | Status: DC

## 2019-05-11 MED ORDER — TRAMADOL HCL 50 MG PO TABS: 50.0000 mg | ORAL_TABLET | Freq: Four times a day (QID) | ORAL | 0 refills | Status: DC | PRN

## 2019-05-11 NOTE — Progress Notes (Addendum)
5 Days Post-Op Procedure(s) (LRB): CORONARY ARTERY BYPASS GRAFTING (CABG) times three using bilateral mammary artery and right saphenous vein harvested with endoscope. (N/A) TRANSESOPHAGEAL ECHOCARDIOGRAM (TEE) (N/A) Subjective: No complaints  Objective: Vital signs in last 24 hours: Temp:  [98.5 F (36.9 C)-101 F (38.3 C)] 98.6 F (37 C) (05/12 0417) Pulse Rate:  [87-113] 93 (05/12 0417) Cardiac Rhythm: Sinus tachycardia (05/11 1900) Resp:  [10-28] 20 (05/12 0639) BP: (127-165)/(84-101) 142/98 (05/12 0639) SpO2:  [92 %-100 %] 97 % (05/12 0417) Weight:  [93.1 kg] 93.1 kg (05/12 0417)  Hemodynamic parameters for last 24 hours:    Intake/Output from previous day: 05/11 0701 - 05/12 0700 In: 582 [P.O.:582] Out: -  Intake/Output this shift: No intake/output data recorded.  General appearance: alert and cooperative Neurologic: intact Heart: regular rate and rhythm, S1, S2 normal, no murmur, click, rub or gallop Lungs: clear to auscultation bilaterally Extremities: extremities normal, atraumatic, no cyanosis or edema Wound: incisions ok  Lab Results: No results for input(s): WBC, HGB, HCT, PLT in the last 72 hours. BMET: No results for input(s): NA, K, CL, CO2, GLUCOSE, BUN, CREATININE, CALCIUM in the last 72 hours.  PT/INR: No results for input(s): LABPROT, INR in the last 72 hours. ABG    Component Value Date/Time   PHART 7.315 (L) 05/06/2019 1803   HCO3 20.6 05/06/2019 1803   TCO2 22 05/06/2019 1803   ACIDBASEDEF 5.0 (H) 05/06/2019 1803   O2SAT 97.0 05/06/2019 1803   CBG (last 3)  Recent Labs    05/08/19 0807  GLUCAP 135*    Assessment/Plan: S/P Procedure(s) (LRB): CORONARY ARTERY BYPASS GRAFTING (CABG) times three using bilateral mammary artery and right saphenous vein harvested with endoscope. (N/A) TRANSESOPHAGEAL ECHOCARDIOGRAM (TEE) (N/A)  POD 5 Hemodynamically stable in sinus rhythm. Continue Lopressor. He has HTN and would resume the Hyzaar and  Norvasc that he was on previously at discharge.  Temp of 101 last night once and back to normal. Incisions look good. May be atelectasis, IV. Urine negative yesterday. Remove saline well, continue IS.  Plan home today. He can remove the chest tube sutures himself in one week and I want to see him in the office with a CXR in 3 wks.   LOS: 6 days    Alleen Borne 05/11/2019

## 2019-05-16 ENCOUNTER — Emergency Department (HOSPITAL_BASED_OUTPATIENT_CLINIC_OR_DEPARTMENT_OTHER): Payer: Commercial Managed Care - PPO

## 2019-05-16 ENCOUNTER — Encounter (HOSPITAL_BASED_OUTPATIENT_CLINIC_OR_DEPARTMENT_OTHER): Payer: Self-pay | Admitting: *Deleted

## 2019-05-16 ENCOUNTER — Other Ambulatory Visit: Payer: Self-pay

## 2019-05-16 ENCOUNTER — Emergency Department (HOSPITAL_BASED_OUTPATIENT_CLINIC_OR_DEPARTMENT_OTHER)
Admission: EM | Admit: 2019-05-16 | Discharge: 2019-05-16 | Disposition: A | Discharge status: Home / Self Care | Payer: Commercial Managed Care - PPO | Attending: Emergency Medicine | Admitting: Emergency Medicine

## 2019-05-16 DIAGNOSIS — I10 Essential (primary) hypertension: Secondary | ICD-10-CM | POA: Insufficient documentation

## 2019-05-16 DIAGNOSIS — Z951 Presence of aortocoronary bypass graft: Secondary | ICD-10-CM | POA: Diagnosis not present

## 2019-05-16 DIAGNOSIS — R0789 Other chest pain: Secondary | ICD-10-CM | POA: Diagnosis not present

## 2019-05-16 DIAGNOSIS — Z7982 Long term (current) use of aspirin: Secondary | ICD-10-CM | POA: Insufficient documentation

## 2019-05-16 DIAGNOSIS — Z79899 Other long term (current) drug therapy: Secondary | ICD-10-CM | POA: Insufficient documentation

## 2019-05-16 DIAGNOSIS — R0602 Shortness of breath: Secondary | ICD-10-CM | POA: Diagnosis present

## 2019-05-16 LAB — CBC WITH DIFFERENTIAL/PLATELET
Abs Immature Granulocytes: 0.03 10*3/uL (ref 0.00–0.07)
Basophils Absolute: 0 10*3/uL (ref 0.0–0.1)
Basophils Relative: 0 %
Eosinophils Absolute: 0.2 10*3/uL (ref 0.0–0.5)
Eosinophils Relative: 2 %
HCT: 36.9 % — ABNORMAL LOW (ref 39.0–52.0)
Hemoglobin: 11.6 g/dL — ABNORMAL LOW (ref 13.0–17.0)
Immature Granulocytes: 0 %
Lymphocytes Relative: 17 %
Lymphs Abs: 1.3 10*3/uL (ref 0.7–4.0)
MCH: 26.1 pg (ref 26.0–34.0)
MCHC: 31.4 g/dL (ref 30.0–36.0)
MCV: 82.9 fL (ref 80.0–100.0)
Monocytes Absolute: 0.7 10*3/uL (ref 0.1–1.0)
Monocytes Relative: 9 %
Neutro Abs: 5.6 10*3/uL (ref 1.7–7.7)
Neutrophils Relative %: 72 %
Platelets: 437 10*3/uL — ABNORMAL HIGH (ref 150–400)
RBC: 4.45 MIL/uL (ref 4.22–5.81)
RDW: 13.2 % (ref 11.5–15.5)
WBC: 7.8 10*3/uL (ref 4.0–10.5)
nRBC: 0 % (ref 0.0–0.2)

## 2019-05-16 LAB — COMPREHENSIVE METABOLIC PANEL
ALT: 60 U/L — ABNORMAL HIGH (ref 0–44)
AST: 29 U/L (ref 15–41)
Albumin: 4 g/dL (ref 3.5–5.0)
Alkaline Phosphatase: 95 U/L (ref 38–126)
Anion gap: 10 (ref 5–15)
BUN: 13 mg/dL (ref 6–20)
CO2: 26 mmol/L (ref 22–32)
Calcium: 9 mg/dL (ref 8.9–10.3)
Chloride: 104 mmol/L (ref 98–111)
Creatinine, Ser: 0.91 mg/dL (ref 0.61–1.24)
GFR calc Af Amer: >60 mL/min (ref >60)
GFR calc non Af Amer: >60 mL/min (ref >60)
Glucose, Bld: 94 mg/dL (ref 70–99)
Potassium: 3.8 mmol/L (ref 3.5–5.1)
Sodium: 140 mmol/L (ref 135–145)
Total Bilirubin: 0.3 mg/dL (ref 0.3–1.2)
Total Protein: 7.6 g/dL (ref 6.5–8.1)

## 2019-05-16 LAB — PROTIME-INR
INR: 1 (ref 0.8–1.2)
Prothrombin Time: 12.6 seconds (ref 11.4–15.2)

## 2019-05-16 LAB — BRAIN NATRIURETIC PEPTIDE: B Natriuretic Peptide: 89.7 pg/mL (ref 0.0–100.0)

## 2019-05-16 LAB — TROPONIN I
Troponin I: 0.04 ng/mL (ref <0.03)
Troponin I: 0.04 ng/mL (ref <0.03)

## 2019-05-16 MED ORDER — IOHEXOL 350 MG/ML SOLN
100.0000 mL | Freq: Once | INTRAVENOUS | Status: AC | PRN
  Administered 2019-05-16: 64 mL via INTRAVENOUS

## 2019-05-16 NOTE — ED Notes (Signed)
Troponin 0.04, results given to ED PA and RN

## 2019-05-16 NOTE — ED Provider Notes (Addendum)
[facility] EMERGENCY DEPARTMENT Provider Note   CSN: [id number] Arrival date & time: 05/16/19  1317    History   Chief Complaint Chief Complaint  Patient presents with  . Shortness of Breath    HPI Edward Lawrence is a 48 y.o. male.     HPI   Edward Lawrence is a [age] y.o. male, with a history of hypercholesterolemia, HTN, and CABG, presenting to the ED with chest discomfort beginning around 9 AM this morning, shortly after waking. He has discomfort in the right mid to lower chest, 1/10, arises with deep breathing, also feels like a "fluttering or bubbling" in the right chest.    Patient came into the ED with chest pain on May 5.  Heart cath was performed May 6 which found multiple areas of severe stenosis.  Patient then underwent three-vessel CABG, performed by Dr. Laneta SimmersBartle, on May 7.  No noted complications during admission.  Discharged May 10 on 325 mg ASA, Lasix, and metoprolol.  Patient states he has not experienced this discomfort previously.  He contacted the CT surgery office, states he spoke with the doctor on call, and was told to come to this facility for a chest x-ray to evaluate for pneumothorax.  Patient denies fever/chills, cough, abdominal pain, syncope, diaphoresis, dizziness, back pain, extremity numbness/weakness, or any other complaints.   Past Medical History:  Diagnosis Date  . High cholesterol   . Hypertension     Patient Active Problem List   Diagnosis Date Noted  . Hx of CABG 05/06/2019  . Unstable angina (HCC) 05/05/2019  . Hypokalemia 05/05/2019  . Chest pain 05/04/2019  . Hyperglycemia 05/04/2019  . Hypertension   . High cholesterol     Past Surgical History:  Procedure Laterality Date  . CORONARY ARTERY BYPASS GRAFT N/A 05/06/2019   Procedure: CORONARY ARTERY BYPASS GRAFTING (CABG) times three using bilateral mammary artery and right saphenous vein harvested with endoscope.;  Surgeon: Alleen BorneBartle, Bryan K, MD;  Location: [facility] OR;  Service: Open  Heart Surgery;  Laterality: N/A;  . LEFT HEART CATH AND CORONARY ANGIOGRAPHY N/A 05/05/2019   Procedure: LEFT HEART CATH AND CORONARY ANGIOGRAPHY;  Surgeon: Iran OuchArida, Muhammad A, MD;  Location: [facility] INVASIVE CV LAB;  Service: Cardiovascular;  Laterality: N/A;  . TEE WITHOUT CARDIOVERSION N/A 05/06/2019   Procedure: TRANSESOPHAGEAL ECHOCARDIOGRAM (TEE);  Surgeon: Alleen BorneBartle, Bryan K, MD;  [facility];  Service: Open Heart Surgery;  Laterality: N/A;        Home Medications    Prior to Admission medications   Medication Sig Start Date End Date Taking? Authorizing Provider  acetaminophen (TYLENOL) 325 MG tablet Take 2 tablets (650 mg total) by mouth every 6 (six) hours as needed for mild pain. 05/11/19   Ardelle BallsZimmerman, Donielle M, PA-C  amLODipine (NORVASC) 10 MG tablet Take 10 mg by mouth daily.  04/06/19   [provider]  aspirin EC 325 MG EC tablet Take 1 tablet (325 mg total) by mouth daily. 05/11/19   Ardelle BallsZimmerman, Donielle M, PA-C  atorvastatin (LIPITOR) 80 MG tablet Take 1 tablet (80 mg total) by mouth every morning. 05/11/19   Ardelle BallsZimmerman, Donielle M, PA-C  cyclobenzaprine (FLEXERIL) 10 MG tablet Take 10 mg by mouth 3 (three) times daily as needed for muscle spasms.     [provider]  fluticasone (FLONASE) 50 MCG/ACT nasal spray Place 1 spray into both nostrils daily.    [provider]  furosemide (LASIX) 40 MG tablet Take 1 tablet (40 mg total) by  mouth daily. For 5 days then stop 05/11/19   Doree Fudge M, PA-C  losartan-hydrochlorothiazide (HYZAAR) 100-12.5 MG tablet Take 1 tablet by mouth daily.     [provider]  magnesium oxide (MAG-OX) 400 MG tablet Take 400 mg by mouth daily.    [provider]  metoprolol tartrate (LOPRESSOR) 50 MG tablet Take 1 tablet (50 mg total) by mouth 2 (two) times daily. 05/11/19   Ardelle Balls, PA-C  Omega-3 Fatty Acids (OMEGA-3 FISH OIL CONCENTRATE) 1000 MG CPDR Take 1,000 mg by mouth daily.    [provider]  potassium chloride 20 MEQ TBCR Take 20 mEq by mouth daily. For 5 days then stop 05/11/19   Doree Fudge M, PA-C  testosterone (ANDROGEL) 50 MG/5GM (1%) GEL Place 5 g onto the skin daily.  10/09/18   [provider]  traMADol (ULTRAM) 50 MG tablet Take 1 tablet (50 mg total) by mouth every 6 (six) hours as needed for moderate pain. 05/11/19   Ardelle Balls, PA-C    Family History Family History  Problem Relation Age of Onset  . Other Mother 44       Shy-Drager Syndrome  . Hypertension Father   . Valvular heart disease Father     Social History Social History   Tobacco Use  . Smoking status: Never Smoker  . Smokeless tobacco: Never Used  Substance Use Topics  . Alcohol use: Never    Frequency: Never  . Drug use: Never     Allergies   Azithromycin   Review of Systems Review of Systems  Constitutional: Negative for chills, diaphoresis and fever.  Respiratory: Positive for shortness of breath. Negative for cough.   Cardiovascular: Positive for chest pain. Negative for palpitations and leg swelling.  Gastrointestinal: Negative for abdominal pain, diarrhea, nausea and vomiting.  Musculoskeletal: Negative for back pain.  Neurological: Negative for syncope, weakness and numbness.  All other systems reviewed and are negative.    Physical Exam Updated Vital Signs BP 127/86 (BP Location: Left Arm)   Pulse 92   Temp 98.1 F (36.7 C) (Oral)   Resp 20   Ht  (1.778 m)   Wt 90 kg   SpO2 99%   BMI 28.47 kg/m   Physical Exam Vitals signs and nursing note reviewed.  Constitutional:      General: He is not in acute distress.    Appearance: He is well-developed. He is not diaphoretic.  HENT:     Head: Normocephalic and atraumatic.     Mouth/Throat:     Mouth: Mucous membranes are moist.     Pharynx: Oropharynx is clear.  Eyes:     Conjunctiva/sclera: Conjunctivae normal.  Neck:     Musculoskeletal: Neck supple.   Cardiovascular:     Rate and Rhythm: Normal rate and regular rhythm.     Pulses: Normal pulses.          Radial pulses are 2+ on the right side and 2+ on the left side.       Posterior tibial pulses are 2+ on the right side and 2+ on the left side.     Heart sounds: Normal heart sounds.     Comments: Tactile temperature in the extremities appropriate and equal bilaterally. Pulmonary:     Effort: Pulmonary effort is normal. No respiratory distress.     Breath sounds: Normal breath sounds.  Chest:     Chest wall: No tenderness.    Abdominal:  Palpations: Abdomen is soft.     Tenderness: There is no abdominal tenderness. There is no guarding.  Musculoskeletal:     Right lower leg: No edema.     Left lower leg: No edema.  Lymphadenopathy:     Cervical: No cervical adenopathy.  Skin:    General: Skin is warm and dry.  Neurological:     Mental Status: He is alert.     Comments: Sensation grossly intact to light touch in the extremities.  Grip strengths equal bilaterally.  Strength 5/5 in all extremities. No gait disturbance. Coordination intact. Cranial nerves III-XII grossly intact. No facial droop.   Psychiatric:        Mood and Affect: Mood and affect normal.        Speech: Speech normal.        Behavior: Behavior normal.      ED Treatments / Results  Labs (all labs ordered are listed, but only abnormal results are displayed) Labs Reviewed  CBC WITH DIFFERENTIAL/PLATELET - Abnormal; Notable for the following components:      Result Value   Hemoglobin 11.6 (*)    HCT 36.9 (*)    Platelets 437 (*)    All other components within normal limits  TROPONIN I - Abnormal; Notable for the following components:   Troponin I 0.04 (*)    All other components within normal limits  COMPREHENSIVE METABOLIC PANEL - Abnormal; Notable for the following components:   ALT 60 (*)    All other components within normal limits  TROPONIN I - Abnormal; Notable for the following components:    Troponin I 0.04 (*)    All other components within normal limits  BRAIN NATRIURETIC PEPTIDE  PROTIME-INR    EKG EKG Interpretation  Date/Time:  Sunday May 16 2019 13:24:23 EDT Ventricular Rate:  85 PR Interval:    QRS Duration: 90 QT Interval:  399 QTC Calculation: 475 R Axis:   62 Text Interpretation:  Sinus rhythm Borderline short PR interval Inferior infarct, old No significant change since last tracing Confirmed by Virgina Norfolk [phone]) on 05/16/2019 1:27:43 PM Also confirmed by Virgina Norfolk [phone]), editor Barbette Hair [phone])  on 05/16/2019 1:35:32 PM   Radiology Dg Chest 2 View  Result Date: 05/16/2019 CLINICAL DATA:  Shortness of breath.  Recent CABG. EXAM: CHEST - 2 VIEW COMPARISON:  05/08/2019 FINDINGS: Changes of recent CABG procedure noted. There is been removal of the right IJ catheter. Normal heart size. No pleural effusion. No pneumothorax. Right lower lobe atelectasis has resolved in the interval. No new findings. IMPRESSION: 1. No complications status post CABG. 2. Interval removal of right IJ catheter 3. Resolution of right lower lobe atelectasis. Electronically Signed   By: Signa Kell M.D.   On: 05/16/2019 13:59   Ct Angio Chest Pe W And/or Wo Contrast  Result Date: 05/16/2019 CLINICAL DATA:  Shortness of breath, PE suspected EXAM: CT ANGIOGRAPHY CHEST WITH CONTRAST TECHNIQUE: Multidetector CT imaging of the chest was performed using the standard protocol during bolus administration of intravenous contrast. Multiplanar CT image reconstructions and MIPs were obtained to evaluate the vascular anatomy. CONTRAST:  64mL OMNIPAQUE IOHEXOL 350 MG/ML SOLN COMPARISON:  None. FINDINGS: Cardiovascular: Examination is somewhat limited by marginal contrast bolus, main pulmonary artery = 209 HU. Within this limitation, there is no pulmonary embolism through the proximal segmental arterial level. Status post recent median sternotomy and CABG. No pericardial effusion.  Mediastinum/Nodes: No enlarged mediastinal, hilar, or axillary lymph nodes. Thyroid gland, trachea, and  esophagus demonstrate no significant findings. Lungs/Pleura: Bandlike bibasilar atelectasis and small pleural effusions. No pleural effusion or pneumothorax. Upper Abdomen: No acute abnormality. Musculoskeletal: No chest wall abnormality. No acute or significant osseous findings. Review of the MIP images confirms the above findings. IMPRESSION: 1. Examination is somewhat limited by marginal contrast bolus, main pulmonary artery = 209 HU. Within this limitation, there is no pulmonary embolism through the proximal segmental arterial level. 2.  Bandlike bibasilar atelectasis and small pleural effusions. 3.  Status post recent median sternotomy and CABG. Electronically Signed   By: Lauralyn Primes M.D.   On: 05/16/2019 15:09    Procedures Procedures (including critical care time)  Medications Ordered in ED Medications  iohexol (OMNIPAQUE) 350 MG/ML injection 100 mL (64 mLs Intravenous Contrast Given 05/16/19 1445)     Initial Impression / Assessment and Plan / ED Course  I have reviewed the triage vital signs and the nursing notes.  Pertinent labs & imaging results that were available during my care of the patient were reviewed by me and considered in my medical decision making (see chart for details).  Clinical Course as of May 15 1742  Wynelle Link May 16, 2019  1343 Patient on his way to xray when I attempted first patient contact.   [SJ]  1540 Discussed CT and lab results to this point.  Patient states he "feels fine."  No additional symptoms.   [SJ]  1726 Spoke with Dr. Donata Clay, CT surgeon. Agrees workup and vital signs are reassuring.  Troponin findings are acceptable.  Reassure patient.  Let him know somebody from the CT surgery office will call him tomorrow to set up an appointment to be seen this week.   [SJ]    Clinical Course User Index [SJ] Wing Gfeller C, PA-C       Patient presents  with chest discomfort beginning this morning. Patient is nontoxic appearing, afebrile, not tachycardic, not tachypneic, not hypotensive, maintains excellent SPO2 on room air, and is in no apparent distress.  Troponin level flat at 0.04 during ED course.  No change in symptoms during ED course.  Lab results overall reassuring. Chest x-ray and CT without acute abnormality.  Patient ambulated while maintaining SPO2 100% on room air and without worsening symptoms or onset of additional symptoms.  He will have close follow-up with CT surgery this week. Return precautions discussed.  Patient voices understanding of these instructions, accepts the plan, and is comfortable with discharge.  Findings and plan of care discussed with Virgina Norfolk, DO.   Vitals:   05/16/19 1330 05/16/19 1400 05/16/19 1500 05/16/19 1530  BP: (!) 139/92 127/88 122/82 115/70  Pulse: 82 86 82 90  Resp: 20 18 19 19   Temp:      TempSrc:      SpO2: 99% 98% 96% 97%  Weight:      Height:       Vitals:   05/16/19 1600 05/16/19 1630 05/16/19 1700 05/16/19 1730  BP: 114/76 109/77 124/84 116/81  Pulse: 85 86 86 88  Resp: 18 20 20 15   Temp:      TempSrc:      SpO2: 97% 96% 100% 95%  Weight:      Height:           Edward Lawrence was evaluated in Emergency Department on 05/16/2019 for the symptoms described in the history of present illness. He was evaluated in the context of the global COVID-19 pandemic, which necessitated consideration that the patient might be at  risk for infection with the SARS-CoV-2 virus that causes COVID-19. Institutional protocols and algorithms that pertain to the evaluation of patients at risk for COVID-19 are in a state of rapid change based on information released by regulatory bodies including the CDC and federal and state organizations. These policies and algorithms were followed during the patient's care in the ED.  Final Clinical Impressions(s) / ED Diagnoses   Final diagnoses:  Chest discomfort     ED Discharge Orders    None       Concepcion Living 05/16/19 1744    Anselm Pancoast, PA-C 05/16/19 1745    Virgina Norfolk, DO 05/17/19 1843

## 2019-05-16 NOTE — ED Triage Notes (Addendum)
Pt d/c from hospital s/p cabg x 3 vessel on 5/12. Today he reports feeling SOB with a "bubbling" feeling right side of chest. He called his surgeon and was advised to come in for a chest xray

## 2019-05-16 NOTE — ED Notes (Signed)
IV attempt x1 right  AC. Blood obtained unable to thread IV cath.

## 2019-05-16 NOTE — ED Notes (Signed)
Troponin 0.04, results given to PA and RN

## 2019-05-16 NOTE — ED Notes (Signed)
Pt states he feels sob that started this morning. States he feels like his right side is "heavier and heavier". States he feels like he has a "weight and bubbles, and feels worse when he breathes in. Denies N/V, no diarrhea, no ever. Pt states he has been using spirometer regularly.

## 2019-05-16 NOTE — ED Notes (Signed)
Pt denies chest pain, states he feels a heaviness in the right side of his chest that worsens with inspiration

## 2019-05-16 NOTE — Discharge Instructions (Addendum)
The work-up today in the emergency department was reassuring.  Please follow-up with the cardiothoracic surgeon.  Their office should be calling you tomorrow to set up an appointment for this week. Should symptoms worsen or additional symptoms arise, please proceed to the emergency department at [facility].

## 2019-05-16 NOTE — ED Notes (Signed)
Pt ambulated around department. SpO2 remained at 100%. HR remained at 101.

## 2019-05-16 NOTE — ED Notes (Signed)
Patient transported to CT 

## 2019-05-17 ENCOUNTER — Other Ambulatory Visit: Payer: Self-pay | Admitting: Surgery

## 2019-05-17 DIAGNOSIS — Z951 Presence of aortocoronary bypass graft: Secondary | ICD-10-CM

## 2019-05-17 LAB — POCT I-STAT 4, (NA,K, GLUC, HGB,HCT)
Glucose, Bld: 112 mg/dL — ABNORMAL HIGH (ref 70–99)
Glucose, Bld: 136 mg/dL — ABNORMAL HIGH (ref 70–99)
Glucose, Bld: 123 mg/dL — ABNORMAL HIGH (ref 70–99)
Glucose, Bld: 159 mg/dL — ABNORMAL HIGH (ref 70–99)
Glucose, Bld: 195 mg/dL — ABNORMAL HIGH (ref 70–99)
HCT: 41 % (ref 39.0–52.0)
HCT: 38 % — ABNORMAL LOW (ref 39.0–52.0)
HCT: 28 % — ABNORMAL LOW (ref 39.0–52.0)
HCT: 31 % — ABNORMAL LOW (ref 39.0–52.0)
HCT: 32 % — ABNORMAL LOW (ref 39.0–52.0)
Hemoglobin: 13.9 g/dL (ref 13.0–17.0)
Hemoglobin: 12.9 g/dL — ABNORMAL LOW (ref 13.0–17.0)
Hemoglobin: 9.5 g/dL — ABNORMAL LOW (ref 13.0–17.0)
Hemoglobin: 10.5 g/dL — ABNORMAL LOW (ref 13.0–17.0)
Hemoglobin: 10.9 g/dL — ABNORMAL LOW (ref 13.0–17.0)
Potassium: 3.5 mmol/L (ref 3.5–5.1)
Potassium: 3.8 mmol/L (ref 3.5–5.1)
Potassium: 3.9 mmol/L (ref 3.5–5.1)
Potassium: 3.7 mmol/L (ref 3.5–5.1)
Potassium: 4.7 mmol/L (ref 3.5–5.1)
Sodium: 142 mmol/L (ref 135–145)
Sodium: 141 mmol/L (ref 135–145)
Sodium: 141 mmol/L (ref 135–145)
Sodium: 138 mmol/L (ref 135–145)
Sodium: 139 mmol/L (ref 135–145)

## 2019-05-17 LAB — POCT I-STAT 7, (LYTES, BLD GAS, ICA,H+H)
Acid-base deficit: 1 mmol/L (ref 0.0–2.0)
Acid-base deficit: 4 mmol/L — ABNORMAL HIGH (ref 0.0–2.0)
Bicarbonate: 26 mmol/L (ref 20.0–28.0)
Bicarbonate: 25.3 mmol/L (ref 20.0–28.0)
Bicarbonate: 22.2 mmol/L (ref 20.0–28.0)
Calcium, Ion: 1.23 mmol/L (ref 1.15–1.40)
Calcium, Ion: 0.98 mmol/L — ABNORMAL LOW (ref 1.15–1.40)
Calcium, Ion: 1.4 mmol/L (ref 1.15–1.40)
HCT: 40 % (ref 39.0–52.0)
HCT: 29 % — ABNORMAL LOW (ref 39.0–52.0)
HCT: 33 % — ABNORMAL LOW (ref 39.0–52.0)
Hemoglobin: 13.6 g/dL (ref 13.0–17.0)
Hemoglobin: 9.9 g/dL — ABNORMAL LOW (ref 13.0–17.0)
Hemoglobin: 11.2 g/dL — ABNORMAL LOW (ref 13.0–17.0)
O2 Saturation: 100 %
O2 Saturation: 100 %
O2 Saturation: 98 %
Potassium: 3.4 mmol/L — ABNORMAL LOW (ref 3.5–5.1)
Potassium: 3.8 mmol/L (ref 3.5–5.1)
Potassium: 3.8 mmol/L (ref 3.5–5.1)
Sodium: 142 mmol/L (ref 135–145)
Sodium: 141 mmol/L (ref 135–145)
Sodium: 140 mmol/L (ref 135–145)
TCO2: 28 mmol/L (ref 22–32)
TCO2: 26 mmol/L (ref 22–32)
TCO2: 24 mmol/L (ref 22–32)
pCO2 arterial: 51 mmHg — ABNORMAL HIGH (ref 32.0–48.0)
pCO2 arterial: 40.9 mmHg (ref 32.0–48.0)
pCO2 arterial: 43.9 mmHg (ref 32.0–48.0)
pH, Arterial: 7.316 — ABNORMAL LOW (ref 7.350–7.450)
pH, Arterial: 7.398 (ref 7.350–7.450)
pH, Arterial: 7.312 — ABNORMAL LOW (ref 7.350–7.450)
pO2, Arterial: 408 mmHg — ABNORMAL HIGH (ref 83.0–108.0)
pO2, Arterial: 303 mmHg — ABNORMAL HIGH (ref 83.0–108.0)
pO2, Arterial: 109 mmHg — ABNORMAL HIGH (ref 83.0–108.0)

## 2019-05-21 ENCOUNTER — Ambulatory Visit: Payer: Commercial Managed Care - PPO | Admitting: Surgery

## 2019-05-27 ENCOUNTER — Telehealth: Payer: Self-pay

## 2019-05-27 ENCOUNTER — Telehealth (INDEPENDENT_AMBULATORY_CARE_PROVIDER_SITE_OTHER): Payer: Commercial Managed Care - PPO | Admitting: Cardiology

## 2019-05-27 ENCOUNTER — Encounter: Payer: Self-pay | Admitting: Cardiology

## 2019-05-27 VITALS — BP 122/76 | HR 74 | Ht 70.0 in | Wt 199.6 lb

## 2019-05-27 DIAGNOSIS — I10 Essential (primary) hypertension: Secondary | ICD-10-CM

## 2019-05-27 DIAGNOSIS — Z951 Presence of aortocoronary bypass graft: Secondary | ICD-10-CM

## 2019-05-27 DIAGNOSIS — E7800 Pure hypercholesterolemia, unspecified: Secondary | ICD-10-CM | POA: Diagnosis not present

## 2019-05-27 MED ORDER — ATORVASTATIN CALCIUM 80 MG PO TABS: 80.0000 mg | ORAL_TABLET | ORAL | 2 refills | Status: DC

## 2019-05-27 NOTE — Patient Instructions (Signed)
Medication Instructions:  Your physician recommends that you continue on your current medications as directed. Please refer to the Current Medication list given to you today. If you need a refill on your cardiac medications before your next appointment, please call your pharmacy.   Lab work: None  If you have labs (blood work) drawn today and your tests are completely normal, you will receive your results only by: Marland Kitchen MyChart Message (if you have MyChart) OR . A paper copy in the mail If you have any lab test that is abnormal or we need to change your treatment, we will call you to review the results.  Testing/Procedures: None   Follow-Up: At [facility], you and your health needs are our priority.  As part of our continuing mission to provide you with exceptional heart care, we have created designated Provider Care Teams.  These Care Teams include your primary Cardiologist (physician) and Advanced Practice Providers (APPs -  Physician Assistants and Nurse Practitioners) who all work together to provide you with the care you need, when you need it. You will need a follow up appointment in 4 months.  Please call our office 2 months in advance to schedule this appointment.  You may see Dr Nanetta Batty or one of the following Advanced Practice Providers on your designated Care Team:   Corine Shelter, PA-C Judy Pimple, [place] . Marjie Skiff, PA-C  Any Other Special Instructions Will Be Listed Below (If Applicable).

## 2019-05-27 NOTE — Progress Notes (Signed)
Virtual Visit via Telephone Note   This visit type was conducted due to national recommendations for restrictions regarding the COVID-19 Pandemic (e.g. social distancing) in an effort to limit this patient's exposure and mitigate transmission in our community.  Due to his co-morbid illnesses, this patient is at least at moderate risk for complications without adequate follow up.  This format is felt to be most appropriate for this patient at this time.  The patient did not have access to video technology/had technical difficulties with video requiring transitioning to audio format only (telephone).  All issues noted in this document were discussed and addressed.  No physical exam could be performed with this format.  Please refer to the patient's chart for his  consent to telehealth for [facility].   Date:  05/27/2019   ID:  Edward Lawrence, DOB 02-Oct-1971, MRN [id number]  Patient Location: Home Provider Location: Office  PCP:  Deatra James, MD  Cardiologist:  Dr Allyson Sabal Electrophysiologist:  None   Evaluation Performed:  Follow-Up Visit  Chief Complaint:  Post CABG  History of Present Illness:    Arad Lemburg is a 48 y.o. male hypertension and hyperlipidemia who presented 05/04/2019 with chest pain.  He did have some EKG changes.  Catheterization was done and revealed severe three-vessel disease.  He ultimately underwent CABG x3 with an LIMA to LAD, RIMA to the diagonal, and SVG to the RCA.  He had a small circumflex that was occluded.  His LV function was normal with an EF of 55 to 60%.  He tolerated this well and was discharged and is contacted today as a post hospital follow-up.  He did go to the emergency room on May 16, 2019 with shortness of breath and right-sided chest pain.  CT scan was done which was unremarkable for pulmonary embolism or heart failure.  He was reassured and discharged.  When contacted today overall I feel like he is doing well.  He had several questions.  We reviewed these  in detail.  I reassured him that his symptoms all sounded typical for post CABG patients.  He has a follow-up with the surgeon next week.  We will arrange for a follow-up with Dr. Gery Pray in September.  The patient does not have symptoms concerning for COVID-19 infection (fever, chills, cough, or new shortness of breath).    Past Medical History:  Diagnosis Date  . High cholesterol   . Hypertension    Past Surgical History:  Procedure Laterality Date  . CORONARY ARTERY BYPASS GRAFT N/A 05/06/2019   Procedure: CORONARY ARTERY BYPASS GRAFTING (CABG) times three using bilateral mammary artery and right saphenous vein harvested with endoscope.;  Surgeon: Alleen Borne, MD;  Location: [facility] OR;  Service: Open Heart Surgery;  Laterality: N/A;  . LEFT HEART CATH AND CORONARY ANGIOGRAPHY N/A 05/05/2019   Procedure: LEFT HEART CATH AND CORONARY ANGIOGRAPHY;  Surgeon: Iran Ouch, MD;  Location: [facility] INVASIVE CV LAB;  Service: Cardiovascular;  Laterality: N/A;  . TEE WITHOUT CARDIOVERSION N/A 05/06/2019   Procedure: TRANSESOPHAGEAL ECHOCARDIOGRAM (TEE);  Surgeon: Alleen Borne, MD;  Location[facility] OR;  Service: Open Heart Surgery;  Laterality: N/A;     Current Meds  Medication Sig  . acetaminophen (TYLENOL) 325 MG tablet Take 2 tablets (650 mg total) by mouth every 6 (six) hours as needed for mild pain.  Marland Kitchen aspirin EC 325 MG EC tablet Take 1 tablet (325 mg total) by mouth daily.  Marland Kitchen atorvastatin (LIPITOR) 80 MG tablet Take  1 tablet (80 mg total) by mouth every morning.  . cyclobenzaprine (FLEXERIL) 10 MG tablet Take 10 mg by mouth 3 (three) times daily as needed for muscle spasms.   . fluticasone (FLONASE) 50 MCG/ACT nasal spray Place 1 spray into both nostrils daily.  Marland Kitchen losartan-hydrochlorothiazide (HYZAAR) 100-12.5 MG tablet Take 1 tablet by mouth daily.   . metoprolol tartrate (LOPRESSOR) 50 MG tablet Take 1 tablet (50 mg total) by mouth 2 (two) times daily.  . Omega-3 Fatty Acids (OMEGA-3 FISH OIL  CONCENTRATE) 1000 MG CPDR Take 1,000 mg by mouth daily.  Marland Kitchen testosterone (ANDROGEL) 50 MG/5GM (1%) GEL Place 5 g onto the skin daily.   . traMADol (ULTRAM) 50 MG tablet Take 1 tablet (50 mg total) by mouth every 6 (six) hours as needed for moderate pain.     Allergies:   Azithromycin   Social History   Tobacco Use  . Smoking status: Never Smoker  . Smokeless tobacco: Never Used  Substance Use Topics  . Alcohol use: Never    Frequency: Never  . Drug use: Never     Family Hx: The patient's family history includes Hypertension in his father; Other (age of onset: 34) in his mother; Valvular heart disease in his father.  ROS:   Please see the history of present illness.    All other systems reviewed and are negative.   Prior CV studies:   The following studies were reviewed today:   Labs/Other Tests and Data Reviewed:    EKG:  An ECG dated 05/16/2019 was personally reviewed today and demonstrated:  NSR, inferior Qs, TWI V2-V4  Recent Labs: 05/04/2019: TSH 1.804 05/07/2019: Magnesium 2.2 05/16/2019: ALT 60; B Natriuretic Peptide 89.7; BUN 13; Creatinine, Ser 0.91; Hemoglobin 11.6; Platelets 437; Potassium 3.8; Sodium 140   Recent Lipid Panel Lab Results  Component Value Date/Time   CHOL 156 05/05/2019 03:25 AM   TRIG 107 05/05/2019 03:25 AM   HDL 34 (L) 05/05/2019 03:25 AM   CHOLHDL 4.6 05/05/2019 03:25 AM   LDLCALC 101 (H) 05/05/2019 03:25 AM    Wt Readings from Last 3 Encounters:  05/27/19 199 lb 9.6 oz (90.5 kg)  05/16/19 198 lb 6.4 oz (90 kg)  05/11/19 205 lb 3.2 oz (93.1 kg)     Objective:    Vital Signs:  BP 122/76   Pulse 74   Ht  (1.778 m)   Wt 199 lb 9.6 oz (90.5 kg)   BMI 28.64 kg/m    VITAL SIGNS:  reviewed  ASSESSMENT & PLAN:    CABG x 3- LIMA-LAD, SVG-RCA, RIMA-Dx1 05/06/2019  HTN- Controlled- continue Hyzaar   Dyslipidemia- continue high dose statin    COVID-19 Education: The signs and symptoms of COVID-19 were discussed with the  patient and how to seek care for testing (follow up with PCP or arrange E-visit).  The importance of social distancing was discussed today.  Time:   Today, I have spent 25 minutes with the patient with telehealth technology discussing the above problems.     Medication Adjustments/Labs and Tests Ordered: Current medicines are reviewed at length with the patient today.  Concerns regarding medicines are outlined above.   Tests Ordered: No orders of the defined types were placed in this encounter.   Medication Changes: No orders of the defined types were placed in this encounter.   Disposition:  Follow up Dr Allyson Sabal in Sept.  ? Does he need genetic testing for his children.  Signed, Corine Shelter, PA-C  05/27/2019 9:46 AM    [facility]

## 2019-05-27 NOTE — Telephone Encounter (Signed)
Called patient to discuss AVS instructions. Edward Lawrence's recommendations and voiced understanding. AVS summary mailed to patient.    

## 2019-05-31 ENCOUNTER — Other Ambulatory Visit: Payer: Self-pay

## 2019-05-31 MED ORDER — ATORVASTATIN CALCIUM 80 MG PO TABS: 80.0000 mg | ORAL_TABLET | ORAL | 3 refills | Status: DC

## 2019-05-31 NOTE — Telephone Encounter (Signed)
Rx(s) sent to pharmacy electronically.  

## 2019-06-07 ENCOUNTER — Ambulatory Visit (INDEPENDENT_AMBULATORY_CARE_PROVIDER_SITE_OTHER): Payer: Self-pay | Admitting: Physician Assistant

## 2019-06-07 ENCOUNTER — Telehealth: Payer: Self-pay | Admitting: Cardiovascular Disease

## 2019-06-07 ENCOUNTER — Other Ambulatory Visit: Payer: Self-pay

## 2019-06-07 ENCOUNTER — Ambulatory Visit
Admission: RE | Admit: 2019-06-07 | Discharge: 2019-06-07 | Disposition: A | Discharge status: Home / Self Care | Payer: Commercial Managed Care - PPO | Source: Ambulatory Visit | Attending: Surgery | Admitting: Surgery

## 2019-06-07 VITALS — BP 111/74 | HR 73 | Temp 97.9°F | Resp 16 | Ht 70.0 in | Wt 196.0 lb

## 2019-06-07 DIAGNOSIS — Z951 Presence of aortocoronary bypass graft: Secondary | ICD-10-CM

## 2019-06-07 NOTE — Telephone Encounter (Signed)
[place] at [phone]. [facility] rehab automated system states office closes at 1630. Lmtcb and stated would check if fax received at [facility] office

## 2019-06-07 NOTE — Progress Notes (Signed)
  HPI:  Patient returns for routine postoperative follow-up having undergone CABG x3 on 05/27/2019 by Dr. Mohammed Kindle for acute coronary syndrome. The patient's early postoperative recovery while in the hospital was uneventful. Since hospital discharge the patient reports having visited the emergency room on 05/16/2023 a "gurgling sensation" with his breathing that was centered over his right lateral chest.  He underwent evaluation in the emergency room that included a CT scan of his chest.  No significant abnormalities were identified and the symptoms quickly resolved over the next few days.  Since that encounter, he has continued to make gradual recovery.  Denies any chest pain or shortness of breath.  He is no longer taking tramadol.  All other medications were reviewed and have not changed since his discharge.   Current Outpatient Medications  Medication Sig Dispense Refill  . acetaminophen (TYLENOL) 325 MG tablet Take 2 tablets (650 mg total) by mouth every 6 (six) hours as needed for mild pain.    Marland Kitchen aspirin EC 325 MG EC tablet Take 1 tablet (325 mg total) by mouth daily. 30 tablet 0  . atorvastatin (LIPITOR) 80 MG tablet Take 1 tablet (80 mg total) by mouth every morning. 90 tablet 3  . cyclobenzaprine (FLEXERIL) 10 MG tablet Take 10 mg by mouth 3 (three) times daily as needed for muscle spasms.     . fluticasone (FLONASE) 50 MCG/ACT nasal spray Place 1 spray into both nostrils daily.    Marland Kitchen losartan-hydrochlorothiazide (HYZAAR) 100-12.5 MG tablet Take 1 tablet by mouth daily.     . metoprolol tartrate (LOPRESSOR) 50 MG tablet Take 1 tablet (50 mg total) by mouth 2 (two) times daily. 60 tablet 1  . Omega-3 Fatty Acids (OMEGA-3 FISH OIL CONCENTRATE) 1000 MG CPDR Take 1,000 mg by mouth daily.    Marland Kitchen testosterone (ANDROGEL) 50 MG/5GM (1%) GEL Place 5 g onto the skin daily.      No current facility-administered medications for this visit.     Physical Exam  Vital signs:  Blood Pressure 111/74   Pulse  73   Respirations 16   Oxygen saturation 98% on room air  Heart: Regular rate and rhythm without murmur Chest: Breath sounds are clear to auscultation anterior and posterior.  The sternotomy incision and chest tube insertion sites are healing with no sign of complication.  Sternum is stable. Extremities: No peripheral edema.  Right leg EVH incision is well approximated and healing with no sign of complication.   Diagnostic Tests:  EXAM: CHEST - 2 VIEW  COMPARISON:  Chest radiographs and CTA 05/16/2019  FINDINGS: Sequelae of CABG are again identified. The cardiomediastinal silhouette is unchanged with normal heart size. The lungs are well inflated and clear. No sizable pleural effusion or pneumothorax is identified. No acute osseous abnormality is seen.  IMPRESSION: No active cardiopulmonary disease.   Electronically Signed   By: Logan Bores M.D.   On: 06/07/2019 12:49  Impression: Edward Lawrence is making progress in recovery 4 weeks status post CABG x3 for acute coronary syndrome.  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 appropriate.  Medications and sternal precautions were reviewed.  He may resume driving.  Plan: -No change in medications -May resume driving -May return to work in 4 weeks as planned -Sternal precautions reviewed -Follow-up with Dr. Mohammed Kindle in 1 month and as needed   Antony Odea, PA-C [facility] [phone]

## 2019-06-07 NOTE — Patient Instructions (Signed)
No change in medications May resume driving May return to work in 4 weeks as planned Follow-up with Dr. Mohammed Kindle and 1 month

## 2019-06-07 NOTE — Telephone Encounter (Signed)
Edward Lawrence is calling to see if Dr Gwenlyn Found will send an order for cardiac rehab for the patient. She states that she had faxed over a referral form on 05/31/19 but have not received it back. Patient wants to start tomorrow. She will refax the referral request

## 2019-06-11 NOTE — Telephone Encounter (Signed)
Form and requested documents faxed to [facility] [phone]

## 2019-07-06 ENCOUNTER — Other Ambulatory Visit: Payer: Self-pay

## 2019-07-07 ENCOUNTER — Ambulatory Visit (INDEPENDENT_AMBULATORY_CARE_PROVIDER_SITE_OTHER): Payer: Self-pay | Admitting: Surgery

## 2019-07-07 ENCOUNTER — Other Ambulatory Visit: Payer: Self-pay | Admitting: Surgery

## 2019-07-07 ENCOUNTER — Encounter: Payer: Self-pay | Admitting: Surgery

## 2019-07-07 ENCOUNTER — Ambulatory Visit
Admission: RE | Admit: 2019-07-07 | Discharge: 2019-07-07 | Disposition: A | Discharge status: Home / Self Care | Payer: Commercial Managed Care - PPO | Source: Ambulatory Visit | Attending: Surgery | Admitting: Surgery

## 2019-07-07 VITALS — BP 118/84 | HR 71 | Temp 97.7°F | Resp 16 | Ht 70.0 in | Wt 194.2 lb

## 2019-07-07 DIAGNOSIS — I249 Acute ischemic heart disease, unspecified: Secondary | ICD-10-CM

## 2019-07-07 DIAGNOSIS — Z951 Presence of aortocoronary bypass graft: Secondary | ICD-10-CM

## 2019-07-07 NOTE — Progress Notes (Signed)
     HPI: Patient returns for routine postoperative follow-up having undergone coronary bypass graft surgery x3 on 05/06/2019. The patient's early postoperative recovery while in the hospital was notable for an uncomplicated postoperative course. Since he was last seen in our office on 06/07/2019 the patient reports that he has been feeling well.  He is walking daily without chest pain or shortness of breath.   Current Outpatient Medications  Medication Sig Dispense Refill  . aspirin EC 325 MG EC tablet Take 1 tablet (325 mg total) by mouth daily. 30 tablet 0  . atorvastatin (LIPITOR) 80 MG tablet Take 1 tablet (80 mg total) by mouth every morning. 90 tablet 3  . cyclobenzaprine (FLEXERIL) 10 MG tablet Take 10 mg by mouth 3 (three) times daily as needed for muscle spasms.     . fluticasone (FLONASE) 50 MCG/ACT nasal spray Place 1 spray into both nostrils daily.    Marland Kitchen losartan-hydrochlorothiazide (HYZAAR) 100-12.5 MG tablet Take 1 tablet by mouth daily.     . metoprolol tartrate (LOPRESSOR) 50 MG tablet Take 1 tablet (50 mg total) by mouth 2 (two) times daily. 60 tablet 1  . Omega-3 Fatty Acids (OMEGA-3 FISH OIL CONCENTRATE) 1000 MG CPDR Take 1,000 mg by mouth daily.    Marland Kitchen testosterone (ANDROGEL) 50 MG/5GM (1%) GEL Place 5 g onto the skin daily.      No current facility-administered medications for this visit.     Physical Exam: BP 118/84 (BP Location: Left Arm)   Pulse 71   Temp 97.7 F (36.5 C) Comment: THERMAL  Resp 16   Ht 5\' 10"  (1.778 m)   Wt 194 lb 3.2 oz (88.1 kg)   SpO2 97% Comment: RA  BMI 27.86 kg/m  He looks well. Cardiac exam shows a regular rate and rhythm with normal heart sounds. Lungs are clear. Chest incision is healing well and sternum is stable. His right leg incision is healing well and there is no peripheral edema.  Diagnostic Tests:  CLINICAL DATA:  48 year old male status post CABG in May.  EXAM: CHEST - 2 VIEW  COMPARISON:  06/07/2019 and earlier.   FINDINGS: Stable lung volumes and mediastinal contours. Trace pleural effusions last month have resolved. The lungs today are clear. Stable cardiac size and mediastinal contours. Visualized tracheal air column is within normal limits. No acute osseous abnormality identified. Negative visible bowel gas pattern.  IMPRESSION: Resolved trace pleural effusions since last month. No acute cardiopulmonary abnormality.   Electronically Signed   By: Genevie Ann M.D.   On: 07/07/2019 11:52   Impression:  Overall [name] is making excellent recovery following coronary bypass graft surgery.  I encouraged him to continue walking as much as possible.  I asked him not to lift anything heavier than 10 pounds for 3 months postoperatively.  Plan:  He will continue to follow-up with cardiology in September and will return to see me if he has any problems with his incisions.   Gaye Pollack, MD [facility] [phone]

## 2019-07-09 ENCOUNTER — Other Ambulatory Visit: Payer: Self-pay | Admitting: Physician Assistant

## 2019-08-03 ENCOUNTER — Other Ambulatory Visit: Payer: Self-pay

## 2019-08-12 ENCOUNTER — Other Ambulatory Visit: Payer: Self-pay

## 2019-08-12 ENCOUNTER — Encounter: Payer: Self-pay | Admitting: Medical

## 2019-08-12 ENCOUNTER — Ambulatory Visit: Payer: Commercial Managed Care - PPO | Admitting: Medical

## 2019-08-12 VITALS — BP 127/79 | HR 69 | Temp 97.5°F | Resp 16 | Ht 70.0 in | Wt 192.2 lb

## 2019-08-12 DIAGNOSIS — I2510 Atherosclerotic heart disease of native coronary artery without angina pectoris: Secondary | ICD-10-CM | POA: Diagnosis not present

## 2019-08-12 DIAGNOSIS — I10 Essential (primary) hypertension: Secondary | ICD-10-CM | POA: Diagnosis not present

## 2019-08-12 DIAGNOSIS — R739 Hyperglycemia, unspecified: Secondary | ICD-10-CM

## 2019-08-12 LAB — HEMOGLOBIN A1C: Hgb A1c MFr Bld: 5.4 % (ref 4.6–6.5)

## 2019-08-12 NOTE — Progress Notes (Signed)
Subjective:    Patient ID: Edward Lawrence, male    DOB: 03-09-1971, 48 y.o.   MRN: [id number]  HPI  Pt in for first time.  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 Non smoker, rare alcohol. Married.  Pt was formerly with Lutricia Horsfall MD. He was seeing various different providers there.  Pt had cabg/triple bypass. Major blockage on all 3 about 12 weeks ago. Pt has been doing cardiac rehab. His goal is to get to 10,000 steps a day. He has done some mixed work out. He stats when he works out has surgical site pain. Particulalry when tries push ups.  Pt stats recently when does push is when his chest wall hurts.  Bp have been over 120 which is cardiologist goal. His diastolics have been less than 80. bp systolics most of time high 120's- 130's. Occasional will get 716'R systolic. Pt has upper arm cuff.   Pt is cutting back on sodium.    Review of Systems  Constitutional: Negative for chills, fatigue and fever.  Respiratory: Negative for cough, chest tightness, shortness of breath and wheezing.   Cardiovascular: Negative for chest pain and palpitations.       Some surgical site pain when attempts push ups.  Gastrointestinal: Negative for abdominal pain, diarrhea, rectal pain and vomiting.  Genitourinary: Negative for dysuria and flank pain.  Musculoskeletal: Negative for back pain, joint swelling and neck pain.  Skin: Negative for rash.  Neurological: Negative for dizziness, weakness and headaches.  Hematological: Negative for adenopathy. Does not bruise/bleed easily.  Psychiatric/Behavioral: Negative for behavioral problems. The patient is not nervous/anxious.     Past Medical History:  Diagnosis Date  . High cholesterol   . Hypertension      Social History   Socioeconomic History  . Marital status: Married    Spouse name: Not on file  . Number of children: Not on file  . Years of education: Not on file  . Highest education level: Not on file  Occupational  History  . Occupation: Corporate treasurer  Social Needs  . Financial resource strain: Not on file  . Food insecurity    Worry: Not on file    Inability: Not on file  . Transportation needs    Medical: Not on file    Non-medical: Not on file  Tobacco Use  . Smoking status: Never Smoker  . Smokeless tobacco: Never Used  Substance and Sexual Activity  . Alcohol use: Never    Frequency: Never  . Drug use: Never  . Sexual activity: Not on file  Lifestyle  . Physical activity    Days per week: Not on file    Minutes per session: Not on file  . Stress: Not on file  Relationships  . Social Herbalist on phone: Not on file    Gets together: Not on file    Attends religious service: Not on file    Active member of club or organization: Not on file    Attends meetings of clubs or organizations: Not on file    Relationship status: Not on file  . Intimate partner violence    Fear of current or ex partner: Not on file    Emotionally abused: Not on file    Physically abused: Not on file    Forced sexual activity: Not on file  Other Topics Concern  . Not on file  Social History Narrative  . Not on file    Past  Surgical History:  Procedure Laterality Date  . CORONARY ARTERY BYPASS GRAFT N/A 05/06/2019   Procedure: CORONARY ARTERY BYPASS GRAFTING (CABG) times three using bilateral mammary artery and right saphenous vein harvested with endoscope.;  Surgeon: Alleen BorneBartle, Bryan K, MD;  Location: [facility] OR;  Service: Open Heart Surgery;  Laterality: N/A;  . LEFT HEART CATH AND CORONARY ANGIOGRAPHY N/A 05/05/2019   Procedure: LEFT HEART CATH AND CORONARY ANGIOGRAPHY;  Surgeon: Iran OuchArida, Muhammad A, MD;  Location: [facility] INVASIVE CV LAB;  Service: Cardiovascular;  Laterality: N/A;  . TEE WITHOUT CARDIOVERSION N/A 05/06/2019   Procedure: TRANSESOPHAGEAL ECHOCARDIOGRAM (TEE);  Surgeon: Alleen BorneBartle, Bryan K, MD;  [facility];  Service: Open Heart Surgery;  Laterality: N/A;    Family History  Problem  Relation Age of Onset  . Other Mother [id number]       Shy-Drager Syndrome  . Hypertension Father   . Valvular heart disease Father     Allergies  Allergen Reactions  . Azithromycin Other (See Comments)    Spikes BP    Current Outpatient Medications on File Prior to Visit  Medication Sig Dispense Refill  . amLODipine (NORVASC) 10 MG tablet Take 10 mg by mouth.    Marland Kitchen. aspirin EC 325 MG EC tablet Take 1 tablet (325 mg total) by mouth daily. 30 tablet 0  . atorvastatin (LIPITOR) 80 MG tablet Take 1 tablet (80 mg total) by mouth every morning. 90 tablet 3  . cyclobenzaprine (FLEXERIL) 10 MG tablet Take 10 mg by mouth 3 (three) times daily as needed for muscle spasms.     . fluticasone (FLONASE) 50 MCG/ACT nasal spray Place 1 spray into both nostrils daily.    Marland Kitchen. losartan-hydrochlorothiazide (HYZAAR) 100-12.5 MG tablet Take 1 tablet by mouth daily.     . metoprolol tartrate (LOPRESSOR) 50 MG tablet Take 1 tablet (50 mg total) by mouth 2 (two) times daily. 60 tablet 1  . Omega-3 Fatty Acids (OMEGA-3 FISH OIL CONCENTRATE) 1000 MG CPDR Take 1,000 mg by mouth daily.    Marland Kitchen. testosterone (ANDROGEL) 50 MG/5GM (1%) GEL Place 5 g onto the skin daily.      No current facility-administered medications on file prior to visit.     BP 127/79   Pulse 69   Temp (!) 97.5 F (36.4 C) (Oral)   Resp 16   Ht 5\' 10"  (1.778 m)   Wt 192 lb 3.2 oz (87.2 kg)   SpO2 100%   BMI 27.58 kg/m       Objective:   Physical Exam  General Mental Status- Alert. General Appearance- Not in acute distress.   Skin General: Color- Normal Color. Moisture- Normal Moisture.  Neck Carotid Arteries- Normal color. Moisture- Normal Moisture. No carotid bruits. No JVD.  Chest and Lung Exam Auscultation: Breath Sounds:-Normal.  Cardiovascular Auscultation:Rythm- Regular. Murmurs & Other Heart Sounds:Auscultation of the heart reveals- No Murmurs.  Abdomen Inspection:-Inspeection Normal. Palpation/Percussion:Note:No mass.  Palpation and Percussion of the abdomen reveal- Non Tender, Non Distended + BS, no rebound or guarding.   Neurologic Cranial Nerve exam:- CN III-XII intact(No nystagmus), symmetric smile. Strength:- 5/5 equal and symmetric strength both upper and lower extremities.      Assessment & Plan:  Good to see you today.  For your history of CABG, continue with current medication regimen.  Your blood pressure is little higher than what your cardiologist prefers.  I think it would  be best for you to call cardiologist office today and talk with cardiologist nurse directly.  Review recent  blood pressure readings with her and they might recommend increasing the Lopressor.  Continue with healthy diet and exercise as tolerated.  Will get A1c today to evaluate 2622-month blood sugar average.  Reminder to get flu vaccine in about a month.  Please update me on recommendations from cardiologist office regarding BP med changes.  Follow-up in 5 to 6 weeks virtual visit or sooner if needed.  Esperanza RichtersEdward Saathvik Every, PA-C

## 2019-08-12 NOTE — Patient Instructions (Addendum)
Good to see you today.  For your history of CABG, continue with current medication regimen.  Your blood pressure is little higher than what your cardiologist prefers.  I think it would  be best for you to call cardiologist office today and talk with cardiologist nurse directly.  Review recent blood pressure readings with her and they might recommend increasing the Lopressor.  Continue with healthy diet and exercise as tolerated.  Will get A1c today to evaluate 72-month blood sugar average.  Reminder to get flu vaccine in about a month.  Please update me on recommendations from cardiologist office regarding BP med changes.  Follow-up in 5 to 6 weeks virtual visit or sooner if needed.

## 2019-09-22 ENCOUNTER — Ambulatory Visit (INDEPENDENT_AMBULATORY_CARE_PROVIDER_SITE_OTHER): Payer: Commercial Managed Care - PPO | Admitting: *Deleted

## 2019-09-22 ENCOUNTER — Other Ambulatory Visit: Payer: Self-pay

## 2019-09-22 DIAGNOSIS — Z23 Encounter for immunization: Secondary | ICD-10-CM

## 2019-09-22 NOTE — Progress Notes (Signed)
Patient here today for flu shot.  Vaccine given and patient tolerated well. 

## 2019-09-29 ENCOUNTER — Encounter: Payer: Self-pay | Admitting: Cardiovascular Disease

## 2019-09-29 ENCOUNTER — Ambulatory Visit: Payer: Commercial Managed Care - PPO | Admitting: Cardiovascular Disease

## 2019-09-29 ENCOUNTER — Other Ambulatory Visit: Payer: Self-pay

## 2019-09-29 DIAGNOSIS — I10 Essential (primary) hypertension: Secondary | ICD-10-CM | POA: Diagnosis not present

## 2019-09-29 DIAGNOSIS — E7800 Pure hypercholesterolemia, unspecified: Secondary | ICD-10-CM | POA: Diagnosis not present

## 2019-09-29 DIAGNOSIS — Z951 Presence of aortocoronary bypass graft: Secondary | ICD-10-CM | POA: Diagnosis not present

## 2019-09-29 NOTE — Assessment & Plan Note (Signed)
History of essential hypertension with blood pressure measured today 112/70.  He is on amlodipine, losartan, hydrochlorothiazide and metoprolol.

## 2019-09-29 NOTE — Progress Notes (Signed)
09/29/2019 Edward Lawrence   07-Aug-1971  [id number]  Primary Physician Saguier, Percell Miller PA-C Primary Cardiologist: Lorretta Harp MD [name], [place]  HPI:  Edward Lawrence is a 48 y.o. fit appearing married Caucasian male father of 2 children who works as a Research scientist (medical) from seeing that him back for the first time post hospital in May when he had CABG in the setting of ACS.  His risk factors include treated hypertension and hyperlipidemia.  There is no family history for heart disease.  Never had a heart attack or stroke prior to this.  He had accelerated angina for 6 weeks prior to presentation on 5/5.  His EKG showed no acute changes.  Enzymes are low and flat.  His story was convincing and I recommended he undergo diagnostic coronary arteriography which he did by Dr. Velva Harman the following day revealing three-vessel disease.  He had preserved LV function.  He had underwent CABG x3 on 05/06/2019 by Dr. Cyndia Bent with a LIMA to the LAD, free RIMA to the first diagonal branch and vein to the RCA.  He was discharged home 5 days later.  He did participate in cardiac rehab.  He has since changed his diet, exercises every day and has lost 30 pounds.   Current Meds  Medication Sig  . amLODipine (NORVASC) 10 MG tablet Take 10 mg by mouth.  Marland Kitchen aspirin EC 325 MG EC tablet Take 1 tablet (325 mg total) by mouth daily.  Marland Kitchen atorvastatin (LIPITOR) 80 MG tablet Take 1 tablet (80 mg total) by mouth every morning.  . cyclobenzaprine (FLEXERIL) 10 MG tablet Take 10 mg by mouth 3 (three) times daily as needed for muscle spasms.   . fluticasone (FLONASE) 50 MCG/ACT nasal spray Place 1 spray into both nostrils daily.  Marland Kitchen losartan-hydrochlorothiazide (HYZAAR) 100-12.5 MG tablet Take 1 tablet by mouth daily.   . metoprolol tartrate (LOPRESSOR) 50 MG tablet Take 1 tablet (50 mg total) by mouth 2 (two) times daily.  . Omega-3 Fatty Acids (OMEGA-3 FISH OIL CONCENTRATE) 1000 MG CPDR Take 1,000 mg by mouth daily.  Marland Kitchen  testosterone (ANDROGEL) 50 MG/5GM (1%) GEL Place 5 g onto the skin daily.      Allergies  Allergen Reactions  . Azithromycin Other (See Comments)    Spikes BP    Social History   Socioeconomic History  . Marital status: Married    Spouse name: Not on file  . Number of children: Not on file  . Years of education: Not on file  . Highest education level: Not on file  Occupational History  . Occupation: Corporate treasurer  Social Needs  . Financial resource strain: Not on file  . Food insecurity    Worry: Not on file    Inability: Not on file  . Transportation needs    Medical: Not on file    Non-medical: Not on file  Tobacco Use  . Smoking status: Never Smoker  . Smokeless tobacco: Never Used  Substance and Sexual Activity  . Alcohol use: Never    Frequency: Never  . Drug use: Never  . Sexual activity: Not on file  Lifestyle  . Physical activity    Days per week: Not on file    Minutes per session: Not on file  . Stress: Not on file  Relationships  . Social Herbalist on phone: Not on file    Gets together: Not on file    Attends religious service: Not on  file    Active member of club or organization: Not on file    Attends meetings of clubs or organizations: Not on file    Relationship status: Not on file  . Intimate partner violence    Fear of current or ex partner: Not on file    Emotionally abused: Not on file    Physically abused: Not on file    Forced sexual activity: Not on file  Other Topics Concern  . Not on file  Social History Narrative  . Not on file     Review of Systems: General: negative for chills, fever, night sweats or weight changes.  Cardiovascular: negative for chest pain, dyspnea on exertion, edema, orthopnea, palpitations, paroxysmal nocturnal dyspnea or shortness of breath Dermatological: negative for rash Respiratory: negative for cough or wheezing Urologic: negative for hematuria Abdominal: negative for nausea, vomiting,  diarrhea, bright red blood per rectum, melena, or hematemesis Neurologic: negative for visual changes, syncope, or dizziness All other systems reviewed and are otherwise negative except as noted above.    Blood pressure 112/70, pulse 72, temperature (!) 97.3 F (36.3 C), height 5\' 10"  (1.778 m), weight 188 lb (85.3 kg).  General appearance: alert and no distress Neck: no adenopathy, no carotid bruit, no JVD, supple, symmetrical, trachea midline and thyroid not enlarged, symmetric, no tenderness/mass/nodules Lungs: clear to auscultation bilaterally Heart: regular rate and rhythm, S1, S2 normal, no murmur, click, rub or gallop Extremities: extremities normal, atraumatic, no cyanosis or edema Pulses: 2+ and symmetric Skin: Skin color, texture, turgor normal. No rashes or lesions Neurologic: Alert and oriented X 3, normal strength and tone. Normal symmetric reflexes. Normal coordination and gait  EKG not performed today  ASSESSMENT AND PLAN:   Hypertension History of essential hypertension with blood pressure measured today 112/70.  He is on amlodipine, losartan, hydrochlorothiazide and metoprolol.  High cholesterol History of hyperlipidemia on high-dose statin therapy with lipid profile performed backslash 6/20 revealing cholesterol of 56, LDL 101 and HDL 34.  He is going to have this repeated in the near future by his PCP  Hx of CABG History of CAD status post accelerated angina culminating in admission for ACS on 05/04/2019.  He underwent cardiac catheterization the following day by Dr. 07/04/2019 revealing three-vessel disease and ultimately underwent CABG x3 by Dr. [place] Sink 05/06/2019 with a LIMA to the LAD, free RIMA to the first diagonal branch and vein to the RCA.  He was discharged home 5 days later.  He did participate in cardiac rehab.  He has since lost 30 pounds as result result of diet and exercise and feels clinically improved.      07/06/2019 MD FACP,FACC,FAHA, [facility]  09/29/2019 5:19 PM

## 2019-09-29 NOTE — Assessment & Plan Note (Signed)
History of CAD status post accelerated angina culminating in admission for ACS on 05/04/2019.  He underwent cardiac catheterization the following day by Dr. Velva Harman revealing three-vessel disease and ultimately underwent CABG x3 by Dr. Cyndia Bent 05/06/2019 with a LIMA to the LAD, free RIMA to the first diagonal branch and vein to the RCA.  He was discharged home 5 days later.  He did participate in cardiac rehab.  He has since lost 30 pounds as result result of diet and exercise and feels clinically improved.

## 2019-09-29 NOTE — Patient Instructions (Signed)
Medication Instructions:  NONE If you need a refill on your cardiac medications before your next appointment, please call your pharmacy.   Lab work: NONE If you have labs (blood work) drawn today and your tests are completely normal, you will receive your results only by: Marland Kitchen MyChart Message (if you have MyChart) OR . A paper copy in the mail If you have any lab test that is abnormal or we need to change your treatment, we will call you to review the results.  Testing/Procedures: NONE  Follow-Up: At [facility], you and your health needs are our priority.  As part of our continuing mission to provide you with exceptional heart care, we have created designated Provider Care Teams.  These Care Teams include your primary Cardiologist (physician) and Advanced Practice Providers (APPs -  Physician Assistants and Nurse Practitioners) who all work together to provide you with the care you need, when you need it. You will need a follow up appointment in 6 months with Dr. Quay Burow.  Please call our office 2 months in advance to schedule this/each appointment.

## 2019-09-29 NOTE — Assessment & Plan Note (Signed)
History of hyperlipidemia on high-dose statin therapy with lipid profile performed backslash 6/20 revealing cholesterol of 56, LDL 101 and HDL 34.  He is going to have this repeated in the near future by his PCP

## 2019-10-06 ENCOUNTER — Ambulatory Visit: Payer: Commercial Managed Care - PPO | Admitting: Medical

## 2019-10-21 ENCOUNTER — Other Ambulatory Visit: Payer: Self-pay

## 2019-10-22 ENCOUNTER — Other Ambulatory Visit: Payer: Self-pay | Admitting: Medical

## 2019-10-22 ENCOUNTER — Encounter: Payer: Self-pay | Admitting: Medical

## 2019-10-22 ENCOUNTER — Ambulatory Visit (HOSPITAL_BASED_OUTPATIENT_CLINIC_OR_DEPARTMENT_OTHER)
Admission: RE | Admit: 2019-10-22 | Discharge: 2019-10-22 | Disposition: A | Discharge status: Home / Self Care | Payer: Commercial Managed Care - PPO | Source: Ambulatory Visit | Attending: Medical | Admitting: Medical

## 2019-10-22 ENCOUNTER — Ambulatory Visit: Payer: Commercial Managed Care - PPO | Admitting: Medical

## 2019-10-22 VITALS — BP 120/74 | HR 65 | Temp 97.0°F | Resp 16 | Ht 70.0 in | Wt 185.8 lb

## 2019-10-22 DIAGNOSIS — M546 Pain in thoracic spine: Secondary | ICD-10-CM

## 2019-10-22 DIAGNOSIS — M545 Low back pain, unspecified: Secondary | ICD-10-CM

## 2019-10-22 MED ORDER — TRAMADOL HCL 50 MG PO TABS: 50.0000 mg | ORAL_TABLET | Freq: Four times a day (QID) | ORAL | 0 refills | Status: AC | PRN

## 2019-10-22 NOTE — Patient Instructions (Signed)
For your history of lower thoracic/lumbar junction region pain with pain radiating to front rib region, I did go ahead and place thoracic and lumbar x-ray to be done today.  I would use over-the-counter lidocaine patch to the area of pain origin.  In addition would use low-dose ibuprofen and tramadol for more severe pain.  Keep an eye on your blood pressure while using low-dose ibuprofen.  Also asked that you get your wife to check your back and rib area for a rash.  Sometimes shingles can have atypical onset pattern.  So if you see any rash let me know immediately and I will write you antiviral medication.  We will go ahead and refer you to sports medicine as well.  They might determine you need MRI to evaluate disks.  Also will ask them to give you opinion on your left foot numbness.  We will see if they think you might have lumbar spine origin for this symptom.  Follow-up date to be determined after imaging review and sports medicine note review.

## 2019-10-22 NOTE — Progress Notes (Signed)
Subjective:    Patient ID: Edward Lawrence, male    DOB: 03/18/1971, 48 y.o.   MRN: [id number]  HPI  Pt in with left lower rib area pain. He states pain started in area that is adjacent to the spine. Pain when he takes deep breath at times and some pain when twist thorax and stretches. Worse pain when takes a deep breath. When he has pain it is in seconds of sharp pain. Random sharp pain when he hic upped one time.   Pt has tried Chiropodistchiropracter since he had his heart surgery. He does have some lower back stiffness. The pad on his left foot at times feels numb and tingly at times.   Pt tried flexeril and ibuprofen.  Pt has no known fall or trauma.    Review of Systems  Constitutional: Negative for chills, fatigue and fever.  Respiratory: Negative for apnea, cough, chest tightness, shortness of breath and wheezing.   Cardiovascular: Negative for chest pain and palpitations.  Gastrointestinal: Negative for abdominal pain, diarrhea and nausea.  Genitourinary: Negative for dysuria.  Musculoskeletal: Positive for back pain. Negative for neck pain.  Skin: Negative for rash.  Neurological: Negative for dizziness and headaches.  Hematological: Negative for adenopathy. Does not bruise/bleed easily.  Psychiatric/Behavioral: Negative for behavioral problems and confusion.   Past Medical History:  Diagnosis Date  . High cholesterol   . Hypertension      Social History   Socioeconomic History  . Marital status: Married    Spouse name: Not on file  . Number of children: Not on file  . Years of education: Not on file  . Highest education level: Not on file  Occupational History  . 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 Financial resource strain: Not on file  . Food insecurity    Worry: Not on file    Inability: Not on file  . Transportation needs    Medical: Not on file    Non-medical: Not on file  Tobacco Use  . Smoking status: Never Smoker  . Smokeless tobacco: Never Used   Substance and Sexual Activity  . Alcohol use: Never    Frequency: Never  . Drug use: Never  . Sexual activity: Not on file  Lifestyle  . Physical activity    Days per week: Not on file    Minutes per session: Not on file  . Stress: Not on file  Relationships  . Social Musicianconnections    Talks on phone: Not on file    Gets together: Not on file    Attends religious service: Not on file    Active member of club or organization: Not on file    Attends meetings of clubs or organizations: Not on file    Relationship status: Not on file  . Intimate partner violence    Fear of current or ex partner: Not on file    Emotionally abused: Not on file    Physically abused: Not on file    Forced sexual activity: Not on file  Other Topics Concern  . Not on file  Social History Narrative  . Not on file    Past Surgical History:  Procedure Laterality Date  . CORONARY ARTERY BYPASS GRAFT N/A 05/06/2019   Procedure: CORONARY ARTERY BYPASS GRAFTING (CABG) times three using bilateral mammary artery and right saphenous vein harvested with endoscope.;  Surgeon: Alleen BorneBartle, Bryan K, MD;  Location: [facility];  Service: Open Heart Surgery;  Laterality: N/A;  . LEFT  HEART CATH AND CORONARY ANGIOGRAPHY N/A 05/05/2019   Procedure: LEFT HEART CATH AND CORONARY ANGIOGRAPHY;  Surgeon: Wellington Hampshire, MD;  Location: [facility];  Service: Cardiovascular;  Laterality: N/A;  . TEE WITHOUT CARDIOVERSION N/A 05/06/2019   Procedure: TRANSESOPHAGEAL ECHOCARDIOGRAM (TEE);  Surgeon: Gaye Pollack, MD;  Location: [place];  Service: Open Heart Surgery;  Laterality: N/A;    Family History  Problem Relation Age of Onset  . Other Mother 72       Shy-Drager Syndrome  . Hypertension Father   . Valvular heart disease Father     Allergies  Allergen Reactions  . Azithromycin Other (See Comments)    Spikes BP    Current Outpatient Medications on File Prior to Visit  Medication Sig Dispense Refill  . amLODipine (NORVASC)  10 MG tablet Take 10 mg by mouth.    Marland Kitchen aspirin EC 325 MG EC tablet Take 1 tablet (325 mg total) by mouth daily. 30 tablet 0  . atorvastatin (LIPITOR) 80 MG tablet Take 1 tablet (80 mg total) by mouth every morning. 90 tablet 3  . cyclobenzaprine (FLEXERIL) 10 MG tablet Take 10 mg by mouth 3 (three) times daily as needed for muscle spasms.     Marland Kitchen losartan-hydrochlorothiazide (HYZAAR) 100-12.5 MG tablet Take 1 tablet by mouth daily.     . metoprolol tartrate (LOPRESSOR) 50 MG tablet Take 1 tablet (50 mg total) by mouth 2 (two) times daily. 60 tablet 1  . Omega-3 Fatty Acids (OMEGA-3 FISH OIL CONCENTRATE) 1000 MG CPDR Take 1,000 mg by mouth daily.    Marland Kitchen testosterone (ANDROGEL) 50 MG/5GM (1%) GEL Place 5 g onto the skin daily.     . fluticasone (FLONASE) 50 MCG/ACT nasal spray Place 1 spray into both nostrils daily.     No current facility-administered medications on file prior to visit.     BP 120/74   Pulse 65   Temp (!) 97 F (36.1 C) (Temporal)   Resp 16   Ht 5\' 10"  (1.778 m)   Wt 185 lb 12.8 oz (84.3 kg)   SpO2 100%   BMI 26.66 kg/m       Objective:   Physical Exam  General Mental Status- Alert. General Appearance- Not in acute distress.   Skin General: Color- Normal Color. Moisture- Normal Moisture.   Chest and Lung Exam Auscultation: Breath Sounds:-Normal.  Cardiovascular Auscultation:Rythm- Regular. Murmurs & Other Heart Sounds:Auscultation of the heart reveals- No Murmurs.  Abdomen Inspection:-Inspeection Normal. Palpation/Percussion:Note:No mass. Palpation and Percussion of the abdomen reveal- Non Tender, Non Distended + BS, no rebound or guarding.  Back- left side paraspinal tenderness to palpation lower tspine and upper lspine area. Left rib area at that level faint tender. On inspection no rash or vesicles. On palpation no warmth. On deep inspiration rib hurts on that side/    Neurologic Cranial Nerve exam:- CN III-XII intact(No nystagmus), symmetric smile.  Strength:- 5/5 equal and symmetric strength both upper and lower extremities.      Assessment & Plan:  For your history of lower thoracic/lumbar junction region pain with pain radiating to front rib region, I did go ahead and place thoracic and lumbar x-ray to be done today.  I would use over-the-counter lidocaine patch to the area of pain origin.  In addition would use low-dose ibuprofen and tramadol for more severe pain.  Keep an eye on your blood pressure while using low-dose ibuprofen.  Also asked that you get your wife to check your back  and rib area for a rash.  Sometimes shingles can have atypical onset pattern.  So if you see any rash let me know immediately and I will write you antiviral medication.  We will go ahead and refer you to sports medicine as well.  They might determine you need MRI to evaluate disks.  Also will ask them to give you opinion on your left foot numbness.  We will see if they think you might have lumbar spine origin for this symptom.  Follow-up date to be determined after imaging review and sports medicine note review.   25+ minutes spent with pt. 50% of time spent counseling pt on plan going forward.  Esperanza Richters, PA-C

## 2019-10-23 NOTE — Addendum Note (Signed)
Addended by: Anabel Halon on: 10/23/2019 07:57 AM   Modules accepted: Orders

## 2019-10-24 ENCOUNTER — Encounter: Payer: Self-pay | Admitting: Medical

## 2019-10-25 ENCOUNTER — Ambulatory Visit: Payer: Commercial Managed Care - PPO | Admitting: Family Medicine

## 2019-10-25 ENCOUNTER — Other Ambulatory Visit: Payer: Self-pay

## 2019-10-25 ENCOUNTER — Encounter: Payer: Self-pay | Admitting: Family Medicine

## 2019-10-25 VITALS — BP 136/84 | HR 79 | Ht 70.0 in | Wt 180.0 lb

## 2019-10-25 DIAGNOSIS — M545 Low back pain, unspecified: Secondary | ICD-10-CM

## 2019-10-25 DIAGNOSIS — M546 Pain in thoracic spine: Secondary | ICD-10-CM | POA: Insufficient documentation

## 2019-10-25 DIAGNOSIS — R208 Other disturbances of skin sensation: Secondary | ICD-10-CM | POA: Diagnosis not present

## 2019-10-25 DIAGNOSIS — R200 Anesthesia of skin: Secondary | ICD-10-CM

## 2019-10-25 MED ORDER — PREDNISONE 5 MG PO TABS: ORAL_TABLET | ORAL | 0 refills | Status: DC

## 2019-10-25 NOTE — Progress Notes (Signed)
Edward Lawrence - 49 y.o. male MRN [id number]  Date of birth: 1971-08-07  SUBJECTIVE:  Including CC & ROS.  Chief Complaint  Patient presents with  . Back Pain    midline to low back     Edward Lawrence is a 48 y.o. male that is presenting with low back pain as well as foot changes in sensation.  The low back pain is intermittent in nature.  He can be mild to severe.  He notices it with a Valsalva maneuver.  He has been to the chiropractor and has not noticed much of an improvement.  Has tried different medications.  Has a history of a CABG earlier in May.  The pain seems to be localized to the left lower side.  No radicular symptoms.  No history of kidney stones or change in urination.  No abdominal surgeries or changes in bowel movements.  Has noticed an altered sensation between the first and second toe on the left side as well as on the forefoot plantar aspect.  Is intermittent in nature.  Seems to occur after he has been walking for about 45 minutes.  No other symptoms up or down the leg.  Does not stay very long.  No significant pain associated with it.  Independent review of the thoracic x-ray from 10/23 shows a post CABG and no thoracic abnormalities.  Independent review of the lumbar x-ray from 10/23 shows no acute findings   Review of Systems  Constitutional: Negative for fever.  HENT: Negative for congestion.   Respiratory: Negative for cough.   Cardiovascular: Negative for chest pain.  Gastrointestinal: Negative for abdominal pain.  Musculoskeletal: Positive for back pain.  Skin: Negative for color change.  Neurological: Negative for weakness.  Hematological: Negative for adenopathy.    HISTORY: Past Medical, Surgical, Social, and Family History Reviewed & Updated per EMR.   Pertinent Historical Findings include:  Past Medical History:  Diagnosis Date  . High cholesterol   . Hypertension     Past Surgical History:  Procedure Laterality Date  . CORONARY ARTERY BYPASS GRAFT N/A  05/06/2019   Procedure: CORONARY ARTERY BYPASS GRAFTING (CABG) times three using bilateral mammary artery and right saphenous vein harvested with endoscope.;  Surgeon: Gaye Pollack, MD;  Location: [facility];  Service: Open Heart Surgery;  Laterality: N/A;  . LEFT HEART CATH AND CORONARY ANGIOGRAPHY N/A 05/05/2019   Procedure: LEFT HEART CATH AND CORONARY ANGIOGRAPHY;  Surgeon: Wellington Hampshire, MD;  Location: [facility];  Service: Cardiovascular;  Laterality: N/A;  . TEE WITHOUT CARDIOVERSION N/A 05/06/2019   Procedure: TRANSESOPHAGEAL ECHOCARDIOGRAM (TEE);  Surgeon: Gaye Pollack, MD;  Location: [place];  Service: Open Heart Surgery;  Laterality: N/A;    Allergies  Allergen Reactions  . Azithromycin Other (See Comments)    Spikes BP    Family History  Problem Relation Age of Onset  . Other Mother 41       Shy-Drager Syndrome  . Hypertension Father   . Valvular heart disease Father      Social History   Socioeconomic History  . Marital status: Married    Spouse name: Not on file  . Number of children: Not on file  . Years of education: Not on file  . Highest education level: Not on file  Occupational History  . Occupation: Corporate treasurer  Social Needs  . Financial resource strain: Not on file  . Food insecurity    Worry: Not on file  Inability: Not on file  . Transportation needs    Medical: Not on file    Non-medical: Not on file  Tobacco Use  . Smoking status: Never Smoker  . Smokeless tobacco: Never Used  Substance and Sexual Activity  . Alcohol use: Never    Frequency: Never  . Drug use: Never  . Sexual activity: Not on file  Lifestyle  . Physical activity    Days per week: Not on file    Minutes per session: Not on file  . Stress: Not on file  Relationships  . Social Musician on phone: Not on file    Gets together: Not on file    Attends religious service: Not on file    Active member of club or organization: Not on file    Attends  meetings of clubs or organizations: Not on file    Relationship status: Not on file  . Intimate partner violence    Fear of current or ex partner: Not on file    Emotionally abused: Not on file    Physically abused: Not on file    Forced sexual activity: Not on file  Other Topics Concern  . Not on file  Social History Narrative  . Not on file     PHYSICAL EXAM:  VS: BP 136/84   Pulse 79   Ht 5\' 10"  (1.778 m)   Wt 180 lb (81.6 kg)   BMI 25.83 kg/m  Physical Exam Gen: NAD, alert, cooperative with exam, well-appearing ENT: normal lips, normal nasal mucosa,  Eye: normal EOM, normal conjunctiva and lids CV:  no edema, +2 pedal pulses   Resp: no accessory muscle use, non-labored,   Skin: no rashes, no areas of induration  Neuro: normal tone, normal sensation to touch Psych:  normal insight, alert and oriented MSK:  Back: No area of tenderness of the midline thoracic or lumbar spine. No tenderness over the left paraspinal region of the thoracic and lumbar region. Normal flexion extension. Negative straight leg raise. Left foot: No signs of atrophy. Normal strength resistance. Normal ankle range of motion. Fairly neutral arch. Neurovascularly intact     ASSESSMENT & PLAN:   Numbness of left foot This seems to be in the dermatome of the L5 nerve.  Likely having some fatigue and nerve impingement.  He notices this sensational change after walking after 2 to 3 miles. -Counseled on home exercise therapy and supportive care. -Can consider physical therapy.  Acute left-sided low back pain without sciatica Unclear if this is musculoskeletal or related to nephrolithiasis or pancreatitis or some other origin.  Has not had much improvement with the chiropractor.  Imaging was normal.  Does not appear to be shingles.  Possible for costochondritis. -Lipase, amylase, urinalysis and urine culture. -Counseled on home exercise therapy and supportive care. -Prednisone. - may need to  consider CT renal if no improvement or CT ab/pelvis

## 2019-10-25 NOTE — Assessment & Plan Note (Signed)
Unclear if this is musculoskeletal or related to nephrolithiasis or pancreatitis or some other origin.  Has not had much improvement with the chiropractor.  Imaging was normal.  Does not appear to be shingles.  Possible for costochondritis. -Lipase, amylase, urinalysis and urine culture. -Counseled on home exercise therapy and supportive care. -Prednisone. - may need to consider CT renal if no improvement or CT ab/pelvis

## 2019-10-25 NOTE — Assessment & Plan Note (Signed)
This seems to be in the dermatome of the L5 nerve.  Likely having some fatigue and nerve impingement.  He notices this sensational change after walking after 2 to 3 miles. -Counseled on home exercise therapy and supportive care. -Can consider physical therapy.

## 2019-10-25 NOTE — Patient Instructions (Signed)
Nice to meet you Please try the prednisone  I will call you back with the results.   Please send me a message in MyChart with any questions or updates.  Please see [place] back in 4 weeks. This may change based on the results.   --Dr. Raeford Razor

## 2019-10-26 ENCOUNTER — Telehealth: Payer: Self-pay | Admitting: Family Medicine

## 2019-10-26 LAB — URINALYSIS
Bilirubin, UA: NEGATIVE
Glucose, UA: NEGATIVE
Ketones, UA: NEGATIVE
Leukocytes,UA: NEGATIVE
Nitrite, UA: NEGATIVE
Protein,UA: NEGATIVE
RBC, UA: NEGATIVE
Specific Gravity, UA: 1.008 (ref 1.005–1.030)
Urobilinogen, Ur: 0.2 mg/dL (ref 0.2–1.0)
pH, UA: 6.5 (ref 5.0–7.5)

## 2019-10-26 LAB — AMYLASE: Amylase: 42 U/L (ref 31–110)

## 2019-10-26 LAB — LIPASE: Lipase: 25 U/L (ref 13–78)

## 2019-10-26 NOTE — Telephone Encounter (Signed)
Patient returning call for results 

## 2019-10-26 NOTE — Telephone Encounter (Signed)
Left VM for patient. If he calls back please have him speak with a nurse/CMA and inform that his labs are normal and urine doesn't suggest an kidney stone. We can see if the medicine helps with his pain but if it doesn't then that is when he would need to let us know and we would consider a CT.   If any questions then please take the best time and phone number to call and I will try to call him back.   Rosemarie Ax, MD [facility] 10/26/2019, 10:04 AM

## 2019-10-26 NOTE — Telephone Encounter (Signed)
Spoke to patient and gave him information provided by physician. 

## 2019-10-27 ENCOUNTER — Encounter: Payer: Self-pay | Admitting: Family Medicine

## 2019-10-27 LAB — URINE CULTURE: Organism ID, Bacteria: NO GROWTH

## 2019-11-07 ENCOUNTER — Encounter: Payer: Self-pay | Admitting: Medical

## 2019-11-08 MED ORDER — METOPROLOL TARTRATE 50 MG PO TABS: 50.0000 mg | ORAL_TABLET | Freq: Two times a day (BID) | ORAL | 1 refills | Status: DC

## 2019-11-22 ENCOUNTER — Encounter: Payer: Self-pay | Admitting: Family Medicine

## 2019-11-22 ENCOUNTER — Other Ambulatory Visit: Payer: Self-pay

## 2019-11-22 ENCOUNTER — Ambulatory Visit: Payer: Commercial Managed Care - PPO | Admitting: Family Medicine

## 2019-11-22 VITALS — BP 125/86 | HR 71 | Ht 70.0 in | Wt 175.0 lb

## 2019-11-22 DIAGNOSIS — G8929 Other chronic pain: Secondary | ICD-10-CM | POA: Diagnosis not present

## 2019-11-22 DIAGNOSIS — M546 Pain in thoracic spine: Secondary | ICD-10-CM

## 2019-11-22 NOTE — Assessment & Plan Note (Signed)
Acute on chronic in nature.  Pain is ongoing but has improved somewhat.  Pain is been ongoing since his CABG.  It seems more suggestive of a nerve impingement after that surgery.  As it does radiate out laterally. -Counseled on supportive care.  Can consider over-the-counter lidocaine. -Could consider gabapentin. -MRI thoracic spine to evaluate for nerve impingement.

## 2019-11-22 NOTE — Patient Instructions (Signed)
Good to see you Please try the lidocaine patches  We can try the gabapentin if those don't help   Please send me a message in MyChart with any questions or updates.  We will schedule an appointment after the completion of the imaging.   --Dr. Raeford Razor

## 2019-11-22 NOTE — Progress Notes (Signed)
Edward Lawrence - 48 y.o. male MRN [id number]  Date of birth: 1971-03-13  SUBJECTIVE:  Including CC & ROS.  Chief Complaint  Patient presents with  . Follow-up    follow up for left-sided low back    Edward Lawrence is a 47 y.o. male that is following up for his left-sided back pain.  His pain is improved with the course of prednisone.  He still notices the pain intermittently.  It can be sharp.  He feels the pain in the lower thoracic midline spine with radiation laterally.  Lab work was unrevealing for possible abdominal pathology.  Imaging up to this point has been negative.  He only experienced this pain after undergoing his CABG in May.  He is able to do most things.  He still notices the pain severely when he Valsalvas.    Review of Systems  Constitutional: Negative for fever.  HENT: Negative for congestion.   Respiratory: Negative for cough.   Cardiovascular: Negative for chest pain.  Gastrointestinal: Negative for abdominal pain.  Musculoskeletal: Positive for back pain.  Skin: Negative for color change.  Neurological: Negative for weakness.  Hematological: Negative for adenopathy.    HISTORY: Past Medical, Surgical, Social, and Family History Reviewed & Updated per EMR.   Pertinent Historical Findings include:  Past Medical History:  Diagnosis Date  . High cholesterol   . Hypertension     Past Surgical History:  Procedure Laterality Date  . CORONARY ARTERY BYPASS GRAFT N/A 05/06/2019   Procedure: CORONARY ARTERY BYPASS GRAFTING (CABG) times three using bilateral mammary artery and right saphenous vein harvested with endoscope.;  Surgeon: Alleen Borne, MD;  Location: [facility];  Service: Open Heart Surgery;  Laterality: N/A;  . LEFT HEART CATH AND CORONARY ANGIOGRAPHY N/A 05/05/2019   Procedure: LEFT HEART CATH AND CORONARY ANGIOGRAPHY;  Surgeon: Iran Ouch, MD;  Location: [facility];  Service: Cardiovascular;  Laterality: N/A;  . TEE WITHOUT CARDIOVERSION N/A 05/06/2019    Procedure: TRANSESOPHAGEAL ECHOCARDIOGRAM (TEE);  Surgeon: Alleen Borne, MD;  Location: [facility];  Service: Open Heart Surgery;  Laterality: N/A;    Allergies  Allergen Reactions  . Azithromycin Other (See Comments)    Spikes BP    Family History  Problem Relation Age of Onset  . Other Mother 63       Shy-Drager Syndrome  . Hypertension Father   . Valvular heart disease Father      Social History   Socioeconomic History  . Marital status: Married    Spouse name: Not on file  . Number of children: Not on file  . Years of education: Not on file  . Highest education level: Not on file  Occupational History  . Occupation: Risk analyst  Social Needs  . Financial resource strain: Not on file  . Food insecurity    Worry: Not on file    Inability: Not on file  . Transportation needs    Medical: Not on file    Non-medical: Not on file  Tobacco Use  . Smoking status: Never Smoker  . Smokeless tobacco: Never Used  Substance and Sexual Activity  . Alcohol use: Never    Frequency: Never  . Drug use: Never  . Sexual activity: Not on file  Lifestyle  . Physical activity    Days per week: Not on file    Minutes per session: Not on file  . Stress: Not on file  Relationships  . Social connections  Talks on phone: Not on file    Gets together: Not on file    Attends religious service: Not on file    Active member of club or organization: Not on file    Attends meetings of clubs or organizations: Not on file    Relationship status: Not on file  . Intimate partner violence    Fear of current or ex partner: Not on file    Emotionally abused: Not on file    Physically abused: Not on file    Forced sexual activity: Not on file  Other Topics Concern  . Not on file  Social History Narrative  . Not on file     PHYSICAL EXAM:  VS: BP 125/86   Pulse 71   Ht 5\' 10"  (1.778 m)   Wt 175 lb (79.4 kg)   BMI 25.11 kg/m  Physical Exam Gen: NAD, alert, cooperative with  exam, well-appearing ENT: normal lips, normal nasal mucosa,  Eye: normal EOM, normal conjunctiva and lids CV:  no edema, +2 pedal pulses   Resp: no accessory muscle use, non-labored,  Skin: no rashes, no areas of induration  Neuro: normal tone, normal sensation to touch Psych:  normal insight, alert and oriented MSK:  Back:  No tenderness to palpation over the midline thoracic spine. No tenderness to palpation over the left costovertebral angle or paraspinal muscle in the left thoracic area. No step-offs or crepitus. Normal flexion extension. Neurovascularly intact     ASSESSMENT & PLAN:   Thoracic back pain Acute on chronic in nature.  Pain is ongoing but has improved somewhat.  Pain is been ongoing since his CABG.  It seems more suggestive of a nerve impingement after that surgery.  As it does radiate out laterally. -Counseled on supportive care.  Can consider over-the-counter lidocaine. -Could consider gabapentin. -MRI thoracic spine to evaluate for nerve impingement.

## 2019-12-10 ENCOUNTER — Ambulatory Visit
Admission: RE | Admit: 2019-12-10 | Discharge: 2019-12-10 | Disposition: A | Discharge status: Home / Self Care | Payer: Commercial Managed Care - PPO | Source: Ambulatory Visit | Attending: Family Medicine | Admitting: Family Medicine

## 2019-12-10 ENCOUNTER — Other Ambulatory Visit: Payer: Self-pay

## 2019-12-10 DIAGNOSIS — M546 Pain in thoracic spine: Secondary | ICD-10-CM

## 2019-12-10 DIAGNOSIS — G8929 Other chronic pain: Secondary | ICD-10-CM

## 2019-12-13 ENCOUNTER — Telehealth (INDEPENDENT_AMBULATORY_CARE_PROVIDER_SITE_OTHER): Payer: Commercial Managed Care - PPO | Admitting: Family Medicine

## 2019-12-13 DIAGNOSIS — M546 Pain in thoracic spine: Secondary | ICD-10-CM | POA: Diagnosis not present

## 2019-12-13 DIAGNOSIS — G8929 Other chronic pain: Secondary | ICD-10-CM

## 2019-12-13 MED ORDER — GABAPENTIN 300 MG PO CAPS: ORAL_CAPSULE | ORAL | 1 refills | Status: DC

## 2019-12-13 NOTE — Assessment & Plan Note (Signed)
MRI of the thoracic spine was normal.  He could have a postherpetic neuralgia.  He had a CABG in May.  Pain is been ongoing since that time but never developed any rash.  Testing up to this point has been on diagnostic. -Try gabapentin. -Counseled on supportive care. -Could consider nerve block if still occurring and no improvement.

## 2019-12-13 NOTE — Progress Notes (Signed)
Virtual Visit via Video Note  I connected with Edward Lawrence on 12/13/19 at  1:30 PM EST by a video enabled telemedicine application and verified that I am speaking with the correct person using two identifiers.   I discussed the limitations of evaluation and management by telemedicine and the availability of in person appointments. The patient expressed understanding and agreed to proceed.  History of Present Illness:  Edward Lawrence is a 48 year old male that is following up for his left flank pain.  His pain is been ongoing since May.  He did get significant improvement with the prednisone.  He had his MRI of the thoracic spine completed that did not show any abnormalities.  The pain is much more mild and intermittent and tolerable.   Observations/Objective:  Gen: NAD, alert, cooperative with exam, well-appearing ENT: normal lips, normal nasal mucosa,  Eye: normal EOM, normal conjunctiva and lids  Resp: no accessory muscle use, non-labored,  Psych:  normal insight, alert and oriented   Assessment and Plan:  Thoracic back pain:  MRI of the thoracic spine was normal.  He could have a postherpetic neuralgia.  He had a CABG in May.  Pain is been ongoing since that time but never developed any rash.  Testing up to this point has been on diagnostic. -Try gabapentin. -Counseled on supportive care. -Could consider nerve block if still occurring and no improvement.   Follow Up Instructions:    I discussed the assessment and treatment plan with the patient. The patient was provided an opportunity to ask questions and all were answered. The patient agreed with the plan and demonstrated an understanding of the instructions.   The patient was advised to call back or seek an in-person evaluation if the symptoms worsen or if the condition fails to improve as anticipated.    Clearance Coots, MD

## 2020-01-06 MED ORDER — ATORVASTATIN CALCIUM 80 MG PO TABS: 80.0000 mg | ORAL_TABLET | ORAL | 2 refills | Status: DC

## 2020-01-06 MED ORDER — METOPROLOL TARTRATE 50 MG PO TABS: 50.0000 mg | ORAL_TABLET | Freq: Two times a day (BID) | ORAL | 2 refills | Status: DC

## 2020-01-06 MED ORDER — LOSARTAN POTASSIUM-HCTZ 100-12.5 MG PO TABS: 1.0000 | ORAL_TABLET | Freq: Every day | ORAL | 2 refills | Status: DC

## 2020-01-06 MED ORDER — AMLODIPINE BESYLATE 10 MG PO TABS: 10.0000 mg | ORAL_TABLET | Freq: Every day | ORAL | 2 refills | Status: DC

## 2020-01-20 IMAGING — MR MR THORACIC SPINE W/O CM
4 of 6 series · 13 of 48 positions shown · non-contrast
Comparison: None.

CLINICAL DATA: Mid back pain

EXAM:
MRI THORACIC SPINE WITHOUT CONTRAST
TECHNIQUE: Multiplanar, multisequence MR imaging of the thoracic spine was
performed. No intravenous contrast was administered.

[Series 4: T2 · sagittal · 4.0mm · 0.41mm/px · 4 of 13 slices shown (1 of 3)]
[im 1/13]
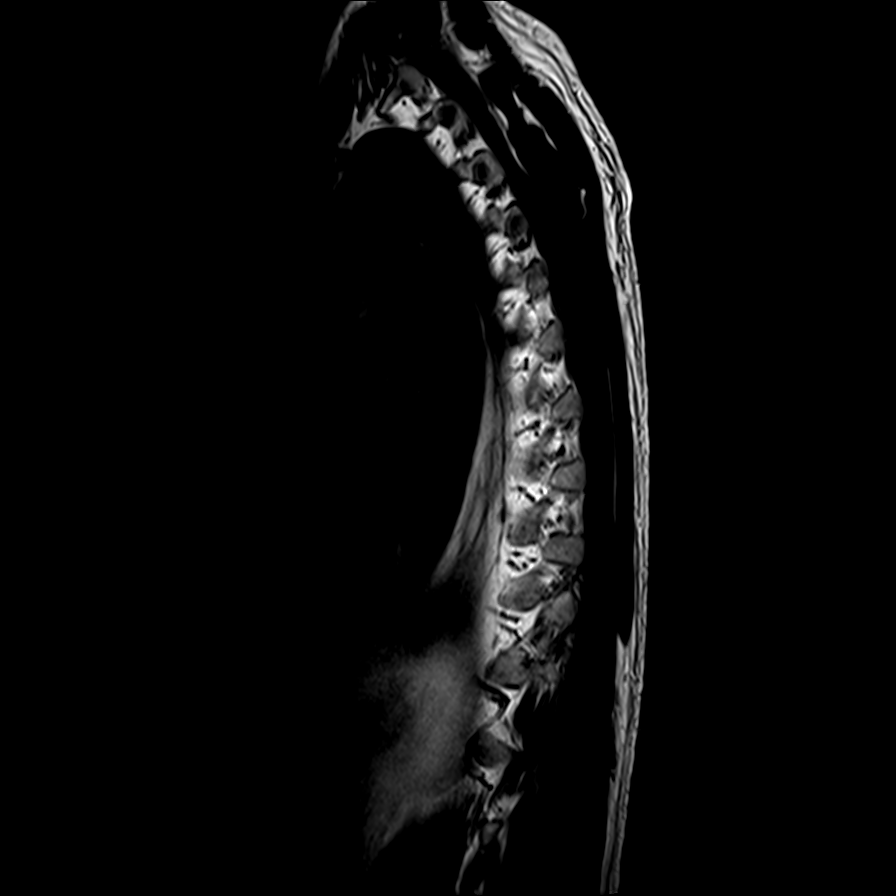
[im 4/13]
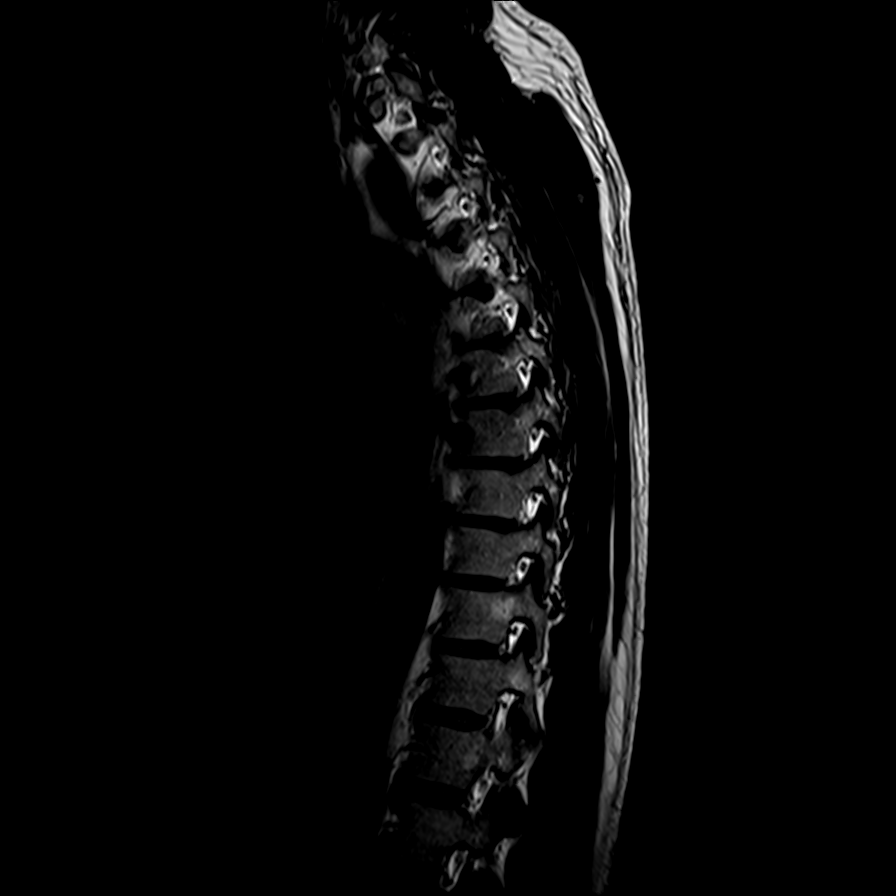
[im 7/13]
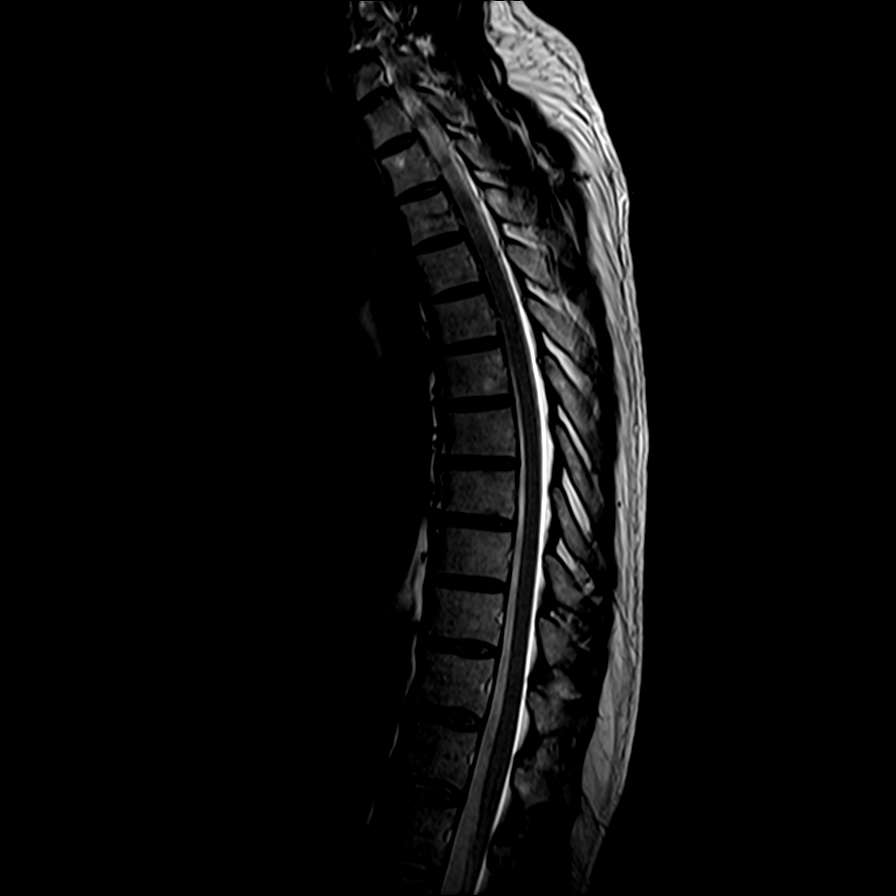
[im 13/13]
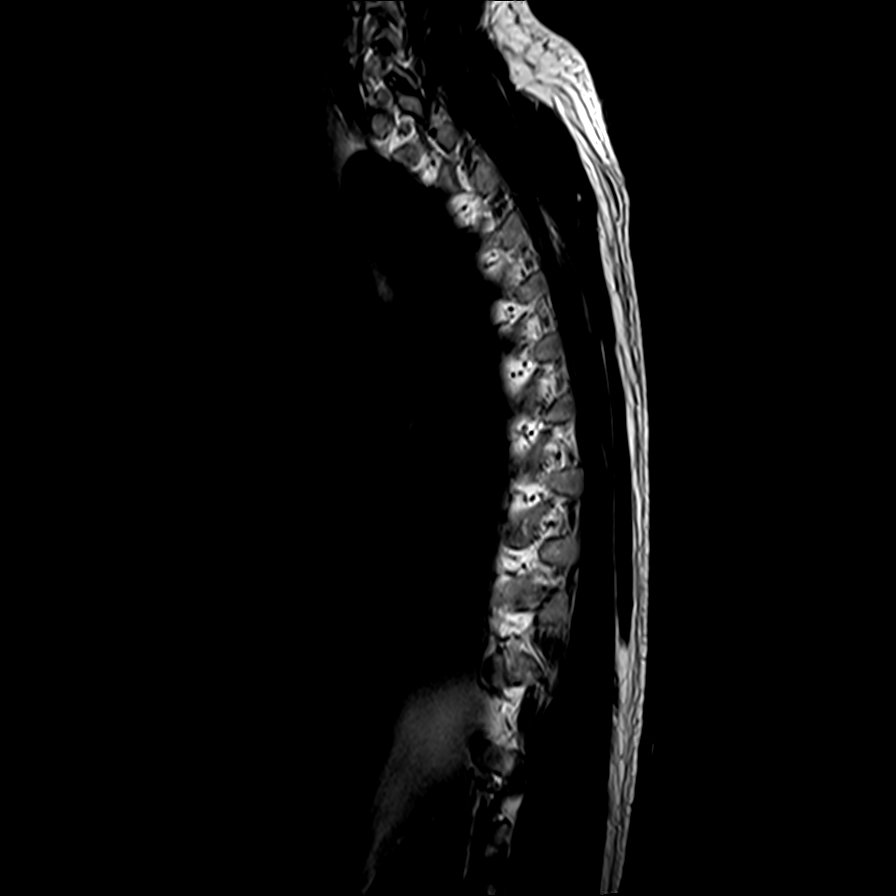

[Series 5: T1 · sagittal · 4.0mm · 0.83mm/px · 3 of 13 slices shown]
[im 1/13]
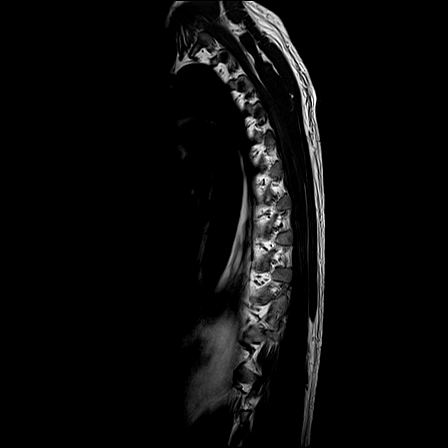
[im 7/13]
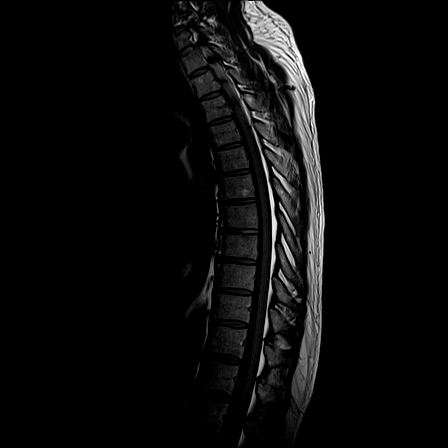
[im 13/13]
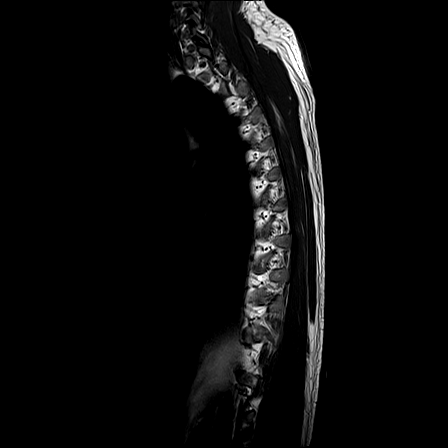

[Series 7: T2 · axial · 4.0mm · 0.39mm/px · z∈[-271,-108]mm · 3 of 39 slices shown (2 of 3)]
[im 6/39]
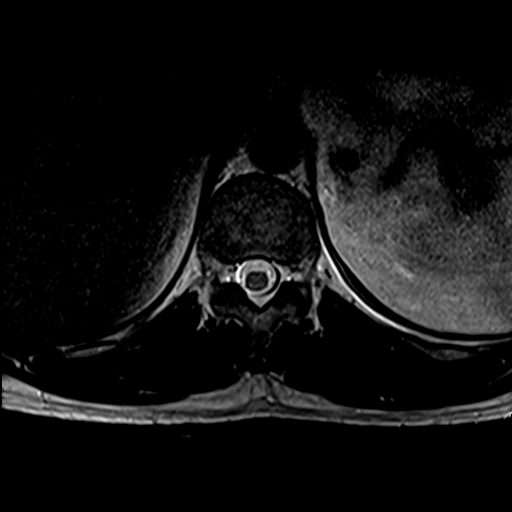
[im 20/39]
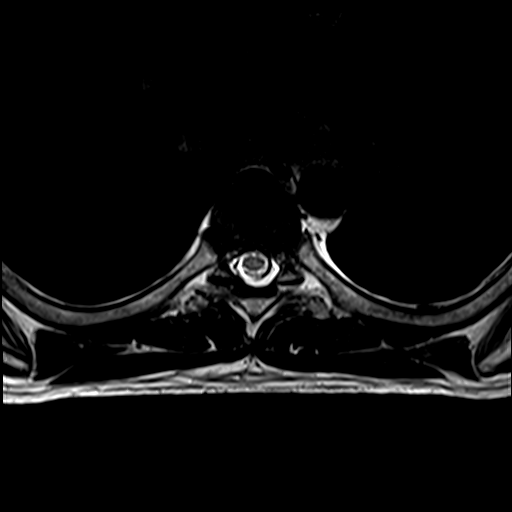
[im 33/39]
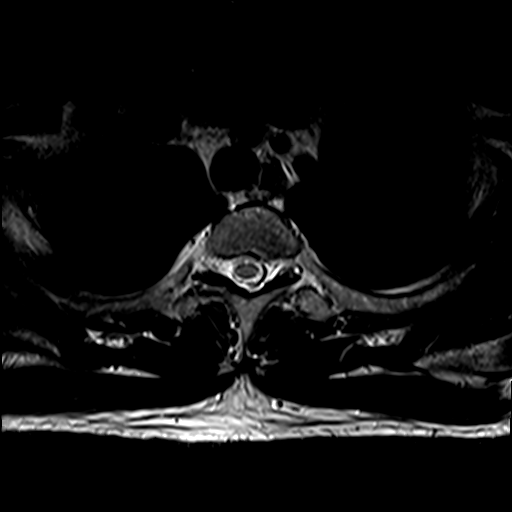

[Series 8: T2 · axial · 4.0mm · 0.39mm/px · z∈[-273,-104]mm · 3 of 39 slices shown (3 of 3)]
[im 6/39]
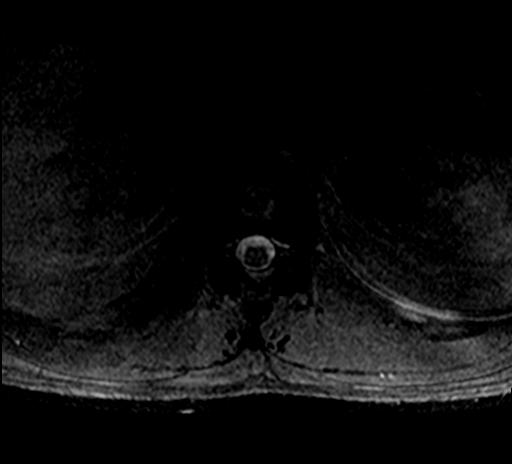
[im 20/39]
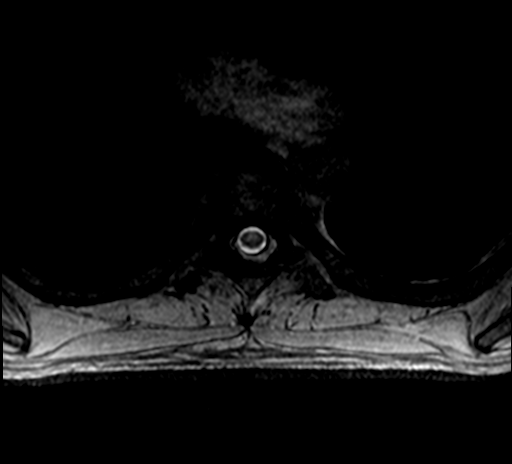
[im 33/39]
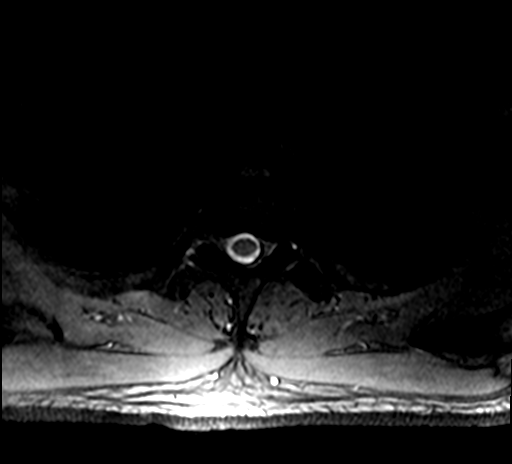

[13 of 48 positions shown; findings below may reference images not displayed]

FINDINGS: Alignment:  Physiologic.

Vertebrae: No fracture, evidence of discitis, or bone lesion.

Cord:  Normal signal and morphology.

Paraspinal and other soft tissues: Negative.

Disc levels:

No disc herniation or stenosis.
IMPRESSION: Normal thoracic spine.

## 2020-01-31 ENCOUNTER — Ambulatory Visit: Payer: Managed Care, Other (non HMO) | Attending: Internal Medicine

## 2020-01-31 DIAGNOSIS — Z20822 Contact with and (suspected) exposure to covid-19: Secondary | ICD-10-CM

## 2020-02-01 LAB — NOVEL CORONAVIRUS, NAA: SARS-CoV-2, NAA: NOT DETECTED

## 2020-02-02 ENCOUNTER — Other Ambulatory Visit: Payer: Commercial Managed Care - PPO

## 2020-02-11 ENCOUNTER — Ambulatory Visit: Payer: Managed Care, Other (non HMO) | Attending: Internal Medicine

## 2020-02-11 DIAGNOSIS — Z20822 Contact with and (suspected) exposure to covid-19: Secondary | ICD-10-CM

## 2020-02-12 LAB — NOVEL CORONAVIRUS, NAA: SARS-CoV-2, NAA: NOT DETECTED

## 2020-03-06 ENCOUNTER — Encounter: Payer: Self-pay | Admitting: Medical

## 2020-03-10 ENCOUNTER — Ambulatory Visit: Payer: Managed Care, Other (non HMO) | Admitting: Medical

## 2020-03-10 ENCOUNTER — Other Ambulatory Visit: Payer: Self-pay

## 2020-03-10 VITALS — BP 125/91 | HR 71 | Temp 96.8°F | Resp 18 | Ht 70.0 in | Wt 181.0 lb

## 2020-03-10 DIAGNOSIS — Z125 Encounter for screening for malignant neoplasm of prostate: Secondary | ICD-10-CM

## 2020-03-10 DIAGNOSIS — E785 Hyperlipidemia, unspecified: Secondary | ICD-10-CM

## 2020-03-10 DIAGNOSIS — R7989 Other specified abnormal findings of blood chemistry: Secondary | ICD-10-CM

## 2020-03-10 DIAGNOSIS — I2510 Atherosclerotic heart disease of native coronary artery without angina pectoris: Secondary | ICD-10-CM | POA: Diagnosis not present

## 2020-03-10 LAB — COMPREHENSIVE METABOLIC PANEL
ALT: 57 U/L — ABNORMAL HIGH (ref 0–53)
AST: 54 U/L — ABNORMAL HIGH (ref 0–37)
Albumin: 4.3 g/dL (ref 3.5–5.2)
Alkaline Phosphatase: 61 U/L (ref 39–117)
BUN: 8 mg/dL (ref 6–23)
CO2: 30 mEq/L (ref 19–32)
Calcium: 9.4 mg/dL (ref 8.4–10.5)
Chloride: 102 mEq/L (ref 96–112)
Creatinine, Ser: 0.88 mg/dL (ref 0.40–1.50)
GFR: 92.59 mL/min (ref >60.00)
Glucose, Bld: 84 mg/dL (ref 70–99)
Potassium: 4.9 mEq/L (ref 3.5–5.1)
Sodium: 139 mEq/L (ref 135–145)
Total Bilirubin: 0.7 mg/dL (ref 0.2–1.2)
Total Protein: 6.5 g/dL (ref 6.0–8.3)

## 2020-03-10 LAB — LIPID PANEL
Cholesterol: 123 mg/dL (ref 0–200)
HDL: 38.2 mg/dL — ABNORMAL LOW (ref >39.00)
LDL Cholesterol: 75 mg/dL (ref 0–99)
NonHDL: 84.82
Total CHOL/HDL Ratio: 3
Triglycerides: 50 mg/dL (ref 0.0–149.0)
VLDL: 10 mg/dL (ref 0.0–40.0)

## 2020-03-10 LAB — PSA: PSA: 1.22 ng/mL (ref 0.10–4.00)

## 2020-03-10 NOTE — Patient Instructions (Addendum)
For hx of low T in past would ask you sign release of form so we can see former t levels. Will get testosterone panel today.  Will get psa for prostate cancer screening.  For CAD and high cholesterol will get lipid panel and cmp.  Benefit vs risk discussion of testosterone discussed.  Follow up date or as needed.

## 2020-03-10 NOTE — Progress Notes (Signed)
Subjective:    Patient ID: Edward Lawrence, male    DOB: [birth year], 49 y.o.   MRN: [id number]  HPI  Pt in for follow up.    Pt in for discussion on hx of low T. He was formerly given t-gel for a couple of years. His psa in the past was normal. He was given testosterone before his cardiac bypass history.    Pt is on gel currently and has about one month worth.  Pt states he has not been off of testosterone for about 8 months following bypass. Then he got back on meds.  No family history of prostate CA or bph that pt knows of.  Pt has hx of CAD and bypass.    Review of Systems  Constitutional: Negative for chills, fatigue and fever.  Respiratory: Negative for cough, chest tightness, shortness of breath and wheezing.   Cardiovascular: Negative for chest pain and palpitations.  Gastrointestinal: Negative for abdominal pain, constipation, nausea and vomiting.  Musculoskeletal: Negative for back pain.  Skin: Negative for rash.  Neurological: Negative for dizziness and headaches.  Hematological: Negative for adenopathy. Does not bruise/bleed easily.  Psychiatric/Behavioral: Negative for behavioral problems, confusion and sleep disturbance. The patient is not nervous/anxious.    Past Medical History:  Diagnosis Date  . High cholesterol   . Hypertension      Social History   Socioeconomic History  . Marital status: Married    Spouse name: Not on file  . Number of children: Not on file  . Years of education: Not on file  . Highest education level: Not on file  Occupational History  . Occupation: Corporate treasurer  Tobacco Use  . Smoking status: Never Smoker  . Smokeless tobacco: Never Used  Substance and Sexual Activity  . Alcohol use: Never  . Drug use: Never  . Sexual activity: Not on file  Other Topics Concern  . Not on file  Social History Narrative  . Not on file   Social Determinants of Health   Financial Resource Strain:   . Difficulty of Paying Living  Expenses:   Food Insecurity:   . Worried About Charity fundraiser in the Last Year:   . Arboriculturist in the Last Year:   Transportation Needs:   . Film/video editor (Medical):   Marland Kitchen Lack of Transportation (Non-Medical):   Physical Activity:   . Days of Exercise per Week:   . Minutes of Exercise per Session:   Stress:   . Feeling of Stress :   Social Connections:   . Frequency of Communication with Friends and Family:   . Frequency of Social Gatherings with Friends and Family:   . Attends Religious Services:   . Active Member of Clubs or Organizations:   . Attends Archivist Meetings:   Marland Kitchen Marital Status:   Intimate Partner Violence:   . Fear of Current or Ex-Partner:   . Emotionally Abused:   Marland Kitchen Physically Abused:   . Sexually Abused:     Past Surgical History:  Procedure Laterality Date  . CORONARY ARTERY BYPASS GRAFT N/A 05/06/2019   Procedure: CORONARY ARTERY BYPASS GRAFTING (CABG) times three using bilateral mammary artery and right saphenous vein harvested with endoscope.;  Surgeon: Gaye Pollack, MD;  Location: [facility] OR;  Service: Open Heart Surgery;  Laterality: N/A;  . LEFT HEART CATH AND CORONARY ANGIOGRAPHY N/A 05/05/2019   Procedure: LEFT HEART CATH AND CORONARY ANGIOGRAPHY;  Surgeon: Wellington Hampshire, MD;  Location: [facility] INVASIVE CV LAB;  Service: Cardiovascular;  Laterality: N/A;  . TEE WITHOUT CARDIOVERSION N/A 05/06/2019   Procedure: TRANSESOPHAGEAL ECHOCARDIOGRAM (TEE);  Surgeon: Alleen Borne, MD;  Location: [facility];  Service: Open Heart Surgery;  Laterality: N/A;    Family History  Problem Relation Age of Onset  . Other Mother 4       Shy-Drager Syndrome  . Hypertension Father   . Valvular heart disease Father     Allergies  Allergen Reactions  . Azithromycin Other (See Comments)    Spikes BP    Current Outpatient Medications on File Prior to Visit  Medication Sig Dispense Refill  . amLODipine (NORVASC) 10 MG tablet Take 1 tablet (10 mg  total) by mouth daily. 90 tablet 2  . aspirin EC 325 MG EC tablet Take 1 tablet (325 mg total) by mouth daily. 30 tablet 0  . atorvastatin (LIPITOR) 80 MG tablet Take 1 tablet (80 mg total) by mouth every morning. 90 tablet 2  . cyclobenzaprine (FLEXERIL) 10 MG tablet Take 10 mg by mouth 3 (three) times daily as needed for muscle spasms.     . fluticasone (FLONASE) 50 MCG/ACT nasal spray Place 1 spray into both nostrils daily.    Marland Kitchen gabapentin (NEURONTIN) 300 MG capsule One tab PO qHS for a week, then BID for a week, then TID. (Patient not taking: Reported on 03/10/2020) 90 capsule 1  . losartan-hydrochlorothiazide (HYZAAR) 100-12.5 MG tablet Take 1 tablet by mouth daily. 90 tablet 2  . metoprolol tartrate (LOPRESSOR) 50 MG tablet Take 1 tablet (50 mg total) by mouth 2 (two) times daily. 180 tablet 2  . Omega-3 Fatty Acids (OMEGA-3 FISH OIL CONCENTRATE) 1000 MG CPDR Take 1,000 mg by mouth daily.    . predniSONE (DELTASONE) 5 MG tablet Take 6 pills for first day, 5 pills second day, 4 pills third day, 3 pills fourth day, 2 pills the fifth day, and 1 pill sixth day. (Patient not taking: Reported on 03/10/2020) 21 tablet 0  . testosterone (ANDROGEL) 50 MG/5GM (1%) GEL Place 5 g onto the skin daily.      No current facility-administered medications on file prior to visit.    BP (!) 125/91 (BP Location: Left Arm, Patient Position: Standing, Cuff Size: Large)   Pulse 71   Temp (!) 96.8 F (36 C) (Temporal)   Resp 18   Ht 5\' 10"  (1.778 m)   Wt 181 lb (82.1 kg)   SpO2 99%   BMI 25.97 kg/m        Objective:   Physical Exam  General- No acute distress. Pleasant patient. Neck- Full range of motion, no jvd Lungs- Clear, even and unlabored. Heart- regular rate and rhythm. Neurologic- CNII- XII grossly intact.       Assessment & Plan:  For hx of low T in past would ask you sign release of form so we can see former t levels. Will get testosterone panel today.  Will get psa for prostate  cancer screening.  For CAD and high cholesterol will get lipid panel and cmp.  Benefit vs risk discussion of testosterone discussed.  Follow up date or as needed.  Keaundra Stehle, PA-C   25 minutes spent with pt.

## 2020-03-13 ENCOUNTER — Telehealth: Payer: Self-pay | Admitting: Medical

## 2020-03-13 LAB — TESTOSTERONE TOTAL,FREE,BIO, MALES
Albumin: 4.2 g/dL (ref 3.6–5.1)
Sex Hormone Binding: 26 nmol/L (ref 10–50)
Testosterone, Bioavailable: 490.6 ng/dL (ref >=110.0)
Testosterone, Free: 254.7 pg/mL — ABNORMAL HIGH (ref 46.0–224.0)
Testosterone: 1194 ng/dL — ABNORMAL HIGH (ref 250–827)

## 2020-03-13 NOTE — Telephone Encounter (Signed)
Opened to review 

## 2020-04-04 ENCOUNTER — Encounter: Payer: Self-pay | Admitting: Cardiovascular Disease

## 2020-04-04 ENCOUNTER — Ambulatory Visit: Payer: Managed Care, Other (non HMO) | Admitting: Cardiovascular Disease

## 2020-04-04 ENCOUNTER — Other Ambulatory Visit: Payer: Self-pay

## 2020-04-04 VITALS — BP 130/82 | HR 63 | Ht 70.0 in | Wt 181.6 lb

## 2020-04-04 DIAGNOSIS — R748 Abnormal levels of other serum enzymes: Secondary | ICD-10-CM | POA: Diagnosis not present

## 2020-04-04 DIAGNOSIS — I10 Essential (primary) hypertension: Secondary | ICD-10-CM

## 2020-04-04 DIAGNOSIS — Z951 Presence of aortocoronary bypass graft: Secondary | ICD-10-CM

## 2020-04-04 NOTE — Patient Instructions (Signed)
Your physician has recommended you make the following change in your medication: HOLD ATORVASTATIN FOR 30 DAYS   Your physician recommends that you return for lab work in: LIVER AFTER 30 DAYS  Your physician wants you to follow-up in: YEAR WITH DR San Morelle will receive a reminder letter in the mail two months in advance. If you don't receive a letter, please call our office to schedule the follow-up appointment.

## 2020-04-04 NOTE — Assessment & Plan Note (Signed)
History of CAD status post cardiac catheterization performed by Dr. Kirke Corin in the setting of accelerated angina 05/06/2019 revealing three-vessel disease.  He subsequent went on to have coronary artery bypass grafting x3 by Dr. Laneta Simmers with a LIMA to his LAD, RIMA to the first diagonal branch (free RIMA) and vein to the RCA.  He was discharged home 5 days later, participate in cardiac rehab had is been asymptomatic since.

## 2020-04-04 NOTE — Assessment & Plan Note (Signed)
History of essential hypertension with blood pressure measured today 130/82.  He is on amlodipine, metoprolol, losartan and hydrochlorothiazide

## 2020-04-04 NOTE — Assessment & Plan Note (Signed)
History of hyperlipidemia on high-dose statin therapy with recent lipid profile performed 03/10/2020 revealing total cholesterol of 123, LDL of 75 and HDL 38.  He is still not quite at goal.  Also noted was mildly elevated transaminases.  I Ernie Hew put him on a 30-day statin holiday and we will recheck liver function test.  If they decline back towards normal he may be a candidate for PCSK9 such as Repatha

## 2020-04-04 NOTE — Progress Notes (Signed)
04/04/2020 Edward Lawrence   [birth year]  [id number]  Primary Physician Saguier, Kateri Mc Primary Cardiologist: Runell Gess MD Nicholes Calamity, [place]  HPI:  Edward Lawrence is a 49 y.o.   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 His risk factors include treated hypertension and hyperlipidemia.  There is no family history for heart disease.  Never had a heart attack or stroke prior to this.  He had accelerated angina for 6 weeks prior to presentation on 5/5.  His EKG showed no acute changes.  Enzymes are low and flat.  His story was convincing and I recommended he undergo diagnostic coronary arteriography which he did by Dr. Big Coppitt Key Sink the following day revealing three-vessel disease.  He had preserved LV function.  He had underwent CABG x3 on 05/06/2019 by Dr. Laneta Simmers with a LIMA to the LAD, free RIMA to the first diagonal branch and vein to the RCA.  He was discharged home 5 days later.  He did participate in cardiac rehab.  He has since changed his diet, exercises every day and has lost 30 pounds.  Since I saw him 8 months ago he continues to do well.  He exercises 30 minutes a day.  He denies chest pain or shortness of breath.   No outpatient medications have been marked as taking for the 04/04/20 encounter (Office Visit) with Runell Gess, MD.     Allergies  Allergen Reactions  . Azithromycin Other (See Comments)    Spikes BP    Social History   Socioeconomic History  . Marital status: Married    Spouse name: Not on file  . Number of children: Not on file  . Years of education: Not on file  . Highest education level: Not on file  Occupational History  . Occupation: Risk analyst  Tobacco Use  . Smoking status: Never Smoker  . Smokeless tobacco: Never Used  Substance and Sexual Activity  . Alcohol use: Never  . Drug use: Never  . Sexual activity: Not on file  Other Topics Concern  . Not  on file  Social History Narrative  . Not on file   Social Determinants of Health   Financial Resource Strain:   . Difficulty of Paying Living Expenses:   Food Insecurity:   . Worried About Programme researcher, broadcasting/film/video in the Last Year:   . Barista in the Last Year:   Transportation Needs:   . Freight forwarder (Medical):   Marland Kitchen Lack of Transportation (Non-Medical):   Physical Activity:   . Days of Exercise per Week:   . Minutes of Exercise per Session:   Stress:   . Feeling of Stress :   Social Connections:   . Frequency of Communication with Friends and Family:   . Frequency of Social Gatherings with Friends and Family:   . Attends Religious Services:   . Active Member of Clubs or Organizations:   . Attends Banker Meetings:   Marland Kitchen Marital Status:   Intimate Partner Violence:   . Fear of Current or Ex-Partner:   . Emotionally Abused:   Marland Kitchen Physically Abused:   . Sexually Abused:      Review of Systems: General: negative for chills, fever, night sweats or weight changes.  Cardiovascular: negative for chest pain, dyspnea on exertion, edema, orthopnea, palpitations, paroxysmal nocturnal dyspnea or shortness of breath Dermatological: negative for  rash Respiratory: negative for cough or wheezing Urologic: negative for hematuria Abdominal: negative for nausea, vomiting, diarrhea, bright red blood per rectum, melena, or hematemesis Neurologic: negative for visual changes, syncope, or dizziness All other systems reviewed and are otherwise negative except as noted above.    Blood pressure 130/82, pulse 63, height 5\' 10"  (1.778 m), weight 181 lb 9.6 oz (82.4 kg).  General appearance: alert and no distress Neck: no adenopathy, no carotid bruit, no JVD, supple, symmetrical, trachea midline and thyroid not enlarged, symmetric, no tenderness/mass/nodules Lungs: clear to auscultation bilaterally Heart: regular rate and rhythm, S1, S2 normal, no murmur, click, rub or  gallop Extremities: extremities normal, atraumatic, no cyanosis or edema Pulses: 2+ and symmetric Skin: Skin color, texture, turgor normal. No rashes or lesions Neurologic: Alert and oriented X 3, normal strength and tone. Normal symmetric reflexes. Normal coordination and gait  EKG sinus rhythm at 63 without ST or T wave changes.  I personally reviewed this EKG.  ASSESSMENT AND PLAN:   Hypertension History of essential hypertension with blood pressure measured today 130/82.  He is on amlodipine, metoprolol, losartan and hydrochlorothiazide  High cholesterol History of hyperlipidemia on high-dose statin therapy with recent lipid profile performed 03/10/2020 revealing total cholesterol of 123, LDL of 75 and HDL 38.  He is still not quite at goal.  Also noted was mildly elevated transaminases.  I Georgina Peer put him on a 30-day statin holiday and we will recheck liver function test.  If they decline back towards normal he may be a candidate for PCSK9 such as Repatha  Hx of CABG History of CAD status post cardiac catheterization performed by Dr. Fletcher Anon in the setting of accelerated angina 05/06/2019 revealing three-vessel disease.  He subsequent went on to have coronary artery bypass grafting x3 by Dr. Cyndia Bent with a LIMA to his LAD, RIMA to the first diagonal branch (free RIMA) and vein to the RCA.  He was discharged home 5 days later, participate in cardiac rehab had is been asymptomatic since.      Lorretta Harp MD FACP,FACC,FAHA, [facility] 04/04/2020 8:15 AM

## 2020-04-12 ENCOUNTER — Encounter: Payer: Self-pay | Admitting: Medical

## 2020-04-13 ENCOUNTER — Telehealth: Payer: Self-pay | Admitting: Medical

## 2020-04-13 NOTE — Telephone Encounter (Signed)
Opened to review 

## 2020-04-16 ENCOUNTER — Telehealth: Payer: Self-pay | Admitting: Medical

## 2020-04-16 MED ORDER — TESTOSTERONE 50 MG/5GM (1%) TD GEL: 5.0000 g | Freq: Every day | TRANSDERMAL | 5 refills | Status: DC

## 2020-04-16 NOTE — Telephone Encounter (Signed)
Rx refill of androgel.

## 2020-04-17 ENCOUNTER — Telehealth: Payer: Self-pay

## 2020-04-17 NOTE — Telephone Encounter (Signed)
PA initiated via Covermymeds; KEY: [id number]. Awaiting determination.

## 2020-04-17 NOTE — Telephone Encounter (Signed)
PA approved. Effective 04/17/20 to 04/17/2023.

## 2020-05-02 ENCOUNTER — Other Ambulatory Visit: Payer: Self-pay | Admitting: Cardiovascular Disease

## 2020-05-03 LAB — HEPATIC FUNCTION PANEL
ALT: 37 IU/L (ref 0–44)
AST: 23 IU/L (ref 0–40)
Albumin: 4.7 g/dL (ref 4.0–5.0)
Alkaline Phosphatase: 65 IU/L (ref 39–117)
Bilirubin Total: 0.5 mg/dL (ref 0.0–1.2)
Bilirubin, Direct: 0.14 mg/dL (ref 0.00–0.40)
Total Protein: 6.5 g/dL (ref 6.0–8.5)

## 2020-06-08 ENCOUNTER — Encounter: Payer: Self-pay | Admitting: Pharmacist Clinician (PhC)/ Clinical Pharmacy Specialist

## 2020-06-08 ENCOUNTER — Other Ambulatory Visit: Payer: Self-pay

## 2020-06-08 ENCOUNTER — Ambulatory Visit (INDEPENDENT_AMBULATORY_CARE_PROVIDER_SITE_OTHER): Payer: Managed Care, Other (non HMO) | Admitting: Pharmacist Clinician (PhC)/ Clinical Pharmacy Specialist

## 2020-06-08 DIAGNOSIS — E7800 Pure hypercholesterolemia, unspecified: Secondary | ICD-10-CM | POA: Diagnosis not present

## 2020-06-08 NOTE — Progress Notes (Signed)
06/08/2020 Edward Lawrence [birth year] [id number]   HPI:  Edward Lawrence is a 49 y.o. male patient of Dr Gwenlyn Found, who presents today for a lipid clinic evaluation.  See pertinent past medical history below.  Since his CABG last spring patient has participated in cardiac rehab and continues to exercise daily.  He also lost 30 pounds and changed his eating habits.  After his  Procedure he was started on atorvastatin 80 mg daily.  Unfortunately this has caused an increase in his liver enzymes.  Dr. Gwenlyn Found had him discontinue the medication and a repeat lab showed a return to normal values.    Today he is here to discuss options.   He continues to work on lifestyle modifications and has lost close to 45 pounds since his surgery.  His goal is to get to 165 and he notes he has hit a plateau at 170.    Past Medical History: ASCVD  S/p CABG x 3 (04/2019)  hypertension Well controlled on amlodipine 10, losartan hctz 100/12.5  Elevated liver enzymes Back to WNL after stopping statin    Current Medications: no current medications.   Cholesterol Goals: LDL < 70   Intolerant/previously tried: atorvastatin 10 and 80 mg doses  Family history: father with hypertension, valve replacement, now 20; mother family with hx of diabetes, mother DM; 2 sisters  - both with high cholesterol, one with pre DM  Diet: has been working hard over past year to change diet.  Now eating mostly plant based diet with plenty of vegetables.     Exercise:  Walk/jog 3 miles per day (goal is 4), tries to hit 10,000 steps/day; back issues limit gym time  Labs: 3/21:  TC 123, TG 50, HDL 38.2, LDL 75 (on atorvastatin 80 - had to d/c due to elevated liver enzymes)  04/2019:  TC 156, TG 107, HDL 34, LDL 101 (atorvastatin 10 mg)   Current Outpatient Medications  Medication Sig Dispense Refill  . amLODipine (NORVASC) 10 MG tablet Take 1 tablet (10 mg total) by mouth daily. 90 tablet 2  . aspirin EC 325 MG EC tablet Take 1 tablet (325 mg total) by  mouth daily. 30 tablet 0  . cyclobenzaprine (FLEXERIL) 10 MG tablet Take 10 mg by mouth 3 (three) times daily as needed for muscle spasms.     . fluticasone (FLONASE) 50 MCG/ACT nasal spray Place 1 spray into both nostrils daily.    Marland Kitchen losartan-hydrochlorothiazide (HYZAAR) 100-12.5 MG tablet Take 1 tablet by mouth daily. 90 tablet 2  . metoprolol tartrate (LOPRESSOR) 50 MG tablet Take 1 tablet (50 mg total) by mouth 2 (two) times daily. 180 tablet 2  . Omega-3 Fatty Acids (OMEGA-3 FISH OIL CONCENTRATE) 1000 MG CPDR Take 1,000 mg by mouth daily.    . predniSONE (DELTASONE) 5 MG tablet Take 6 pills for first day, 5 pills second day, 4 pills third day, 3 pills fourth day, 2 pills the fifth day, and 1 pill sixth day. (Patient not taking: Reported on 03/10/2020) 21 tablet 0  . testosterone (ANDROGEL) 50 MG/5GM (1%) GEL Place 5 g onto the skin daily. 150 g 5   No current facility-administered medications for this visit.    Allergies  Allergen Reactions  . Azithromycin Other (See Comments)    Spikes BP    Past Medical History:  Diagnosis Date  . High cholesterol   . Hypertension     Height 5\' 10"  (1.778 m), weight 170 lb (77.1 kg).   High cholesterol Patient  with hyperlipidemia, was not at goal on atorvastatin 80 mg, but had to discontinue due to elevated liver enzymes.  Reviewed other options for lowering LDL cholesterol, including PCSK-9 inhibitors and bempedoic acid.  Discussed mechanisms of action, dosing, side effects and potential decreases in LDL cholesterol.  Answered all patient questions.  Based on this information, patient would prefer to start Repatha 140 mg every 2 weeks.  He does not have a prescription benefit card on him, so we will need to contact insurer to determine.  He believes it is Advice worker.   Once approved, patient will activate copay card and start medication.  Repeat labs in 3 months.     Phillips Hay PharmD CPP [facility] [address] [place], [place] [address] [phone]

## 2020-06-08 NOTE — Assessment & Plan Note (Signed)
Patient with hyperlipidemia, was not at goal on atorvastatin 80 mg, but had to discontinue due to elevated liver enzymes.  Reviewed other options for lowering LDL cholesterol, including PCSK-9 inhibitors and bempedoic acid.  Discussed mechanisms of action, dosing, side effects and potential decreases in LDL cholesterol.  Answered all patient questions.  Based on this information, patient would prefer to start Repatha 140 mg every 2 weeks.  He does not have a prescription benefit card on him, so we will need to contact insurer to determine.  He believes it is Advice worker.   Once approved, patient will activate copay card and start medication.  Repeat labs in 3 months.

## 2020-06-08 NOTE — Patient Instructions (Addendum)
Your Results:             Your most recent labs Goal  Total Cholesterol 123 < 200  Triglycerides 50 < 150  HDL (happy/good cholesterol) 38.2 > 40  LDL (lousy/bad cholesterol 75 < 70      Medication changes:  We will start the paperwork to get Repatha (or Praluent) covered by your insurance.  Once approved we will send the prescription to your local pharmacy for the first fill.  After that we can send to Mail Order pharmacy.   Please activate the copay card once we let you know the prescription is approved.   Lab orders:  After 5-6 doses we will repeat your cholesterol labs.  We will mail this paperwork to you in about 2 months.      Thank you for choosing [facility]

## 2020-06-12 ENCOUNTER — Telehealth: Payer: Self-pay

## 2020-06-12 MED ORDER — PRALUENT 150 MG/ML ~~LOC~~ SOAJ: 150.0000 mg | SUBCUTANEOUS | 11 refills | Status: DC

## 2020-06-12 NOTE — Telephone Encounter (Signed)
Called and spoke w/pt regarding the approval of the praluent 150mg , rx sent, copay card sent, pt instructed to call back if issues arise, pt voiced understanding

## 2020-08-28 ENCOUNTER — Other Ambulatory Visit: Payer: Self-pay

## 2020-08-28 DIAGNOSIS — E7800 Pure hypercholesterolemia, unspecified: Secondary | ICD-10-CM

## 2020-08-28 DIAGNOSIS — R748 Abnormal levels of other serum enzymes: Secondary | ICD-10-CM

## 2020-09-01 LAB — HEPATIC FUNCTION PANEL
ALT: 39 IU/L (ref 0–44)
AST: 19 IU/L (ref 0–40)
Albumin: 4.4 g/dL (ref 4.0–5.0)
Alkaline Phosphatase: 75 IU/L (ref 48–121)
Bilirubin Total: 0.4 mg/dL (ref 0.0–1.2)
Bilirubin, Direct: 0.12 mg/dL (ref 0.00–0.40)
Total Protein: 6.6 g/dL (ref 6.0–8.5)

## 2020-09-01 LAB — LIPID PANEL
Chol/HDL Ratio: 3 ratio (ref 0.0–5.0)
Cholesterol, Total: 127 mg/dL (ref 100–199)
HDL: 42 mg/dL (ref >39)
LDL Chol Calc (NIH): 70 mg/dL (ref 0–99)
Triglycerides: 77 mg/dL (ref 0–149)
VLDL Cholesterol Cal: 15 mg/dL (ref 5–40)

## 2020-10-02 ENCOUNTER — Other Ambulatory Visit: Payer: Self-pay | Admitting: Cardiovascular Disease

## 2020-12-13 ENCOUNTER — Other Ambulatory Visit: Payer: Self-pay

## 2020-12-13 MED ORDER — AMLODIPINE BESYLATE 10 MG PO TABS: 10.0000 mg | ORAL_TABLET | Freq: Every day | ORAL | 0 refills | Status: DC

## 2020-12-13 MED ORDER — LOSARTAN POTASSIUM-HCTZ 100-12.5 MG PO TABS: 1.0000 | ORAL_TABLET | Freq: Every day | ORAL | 0 refills | Status: DC

## 2020-12-13 MED ORDER — METOPROLOL TARTRATE 50 MG PO TABS: 50.0000 mg | ORAL_TABLET | Freq: Two times a day (BID) | ORAL | 0 refills | Status: DC

## 2020-12-27 ENCOUNTER — Encounter (INDEPENDENT_AMBULATORY_CARE_PROVIDER_SITE_OTHER): Payer: Self-pay

## 2020-12-30 HISTORY — PX: OTHER SURGICAL HISTORY: SHX169

## 2021-02-22 ENCOUNTER — Telehealth: Payer: Self-pay | Admitting: Cardiovascular Disease

## 2021-02-22 NOTE — Telephone Encounter (Signed)
Noted.  appt 4/8 with Dr. Allyson Sabal.    Routed to MD to make aware.

## 2021-02-22 NOTE — Telephone Encounter (Signed)
Patient sent message to pt scheduling requesting an appt with Dr. Allyson Sabal:  "SYS Blood pressure seems to hover between 120 to 130 and sometimes over in spite of regular exercise and a careful diet. Other than stress management is there anything else I can be doing to keep it in a lower range? Read that increasing potassium can help reduce BP, I try to get a lot of that from [place] yogurt daily, but should I also include a supplement?  4/8 would work if it's available. And the questions I sent were not urgent and can wait until my appointment. "

## 2021-03-07 ENCOUNTER — Other Ambulatory Visit: Payer: Self-pay | Admitting: Cardiovascular Disease

## 2021-03-09 ENCOUNTER — Other Ambulatory Visit: Payer: Self-pay

## 2021-03-09 MED ORDER — AMLODIPINE BESYLATE 10 MG PO TABS: 10.0000 mg | ORAL_TABLET | Freq: Every day | ORAL | 1 refills | Status: DC

## 2021-03-14 ENCOUNTER — Encounter: Payer: Self-pay | Admitting: Medical

## 2021-03-21 ENCOUNTER — Ambulatory Visit: Payer: Managed Care, Other (non HMO) | Admitting: Medical

## 2021-03-21 ENCOUNTER — Other Ambulatory Visit: Payer: Self-pay

## 2021-03-21 VITALS — BP 128/83 | HR 71 | Resp 18 | Ht 70.0 in | Wt 178.0 lb

## 2021-03-21 DIAGNOSIS — M25561 Pain in right knee: Secondary | ICD-10-CM | POA: Diagnosis not present

## 2021-03-21 NOTE — Progress Notes (Signed)
Subjective:    Patient ID: Edward Lawrence, male    DOB: 06-Dec-1971, 50 y.o.   MRN: [id number]  HPI  Pt in with some recent knee region pain. Pt is pointing to lateral knee area. Pt has been going on for about a month. Pain is very random and sharp with particular movement. Pt states getting on knees will usually cause pain.   Pt states when he jogs 3.5 miles 3-4 times a week won't have pain. After he states does not hurt. He took break from jogging for one week and pain not improved.  Frequency of pain is once or twice a day random over past month.   Some occasional itching sensation. No rash. No vesicles.       Review of Systems  Constitutional: Negative for chills, fatigue and fever.  Respiratory: Negative for cough, chest tightness, shortness of breath and wheezing.   Cardiovascular: Negative for chest pain and palpitations.  Gastrointestinal: Negative for abdominal pain.  Musculoskeletal:       Rt knee pain. See hpi.   Skin: Negative for rash.  Neurological: Negative for dizziness, seizures, weakness, numbness and headaches.  Hematological: Negative for adenopathy. Does not bruise/bleed easily.  Psychiatric/Behavioral: Negative for behavioral problems.    Past Medical History:  Diagnosis Date  . High cholesterol   . Hypertension      Social History   Socioeconomic History  . Marital status: Married    Spouse name: Not on file  . Number of children: Not on file  . Years of education: Not on file  . Highest education level: Not on file  Occupational History  . Occupation: Risk analyst  Tobacco Use  . Smoking status: Never Smoker  . Smokeless tobacco: Never Used  Vaping Use  . Vaping Use: Never used  Substance and Sexual Activity  . Alcohol use: Never  . Drug use: Never  . Sexual activity: Not on file  Other Topics Concern  . Not on file  Social History Narrative  . Not on file   Social Determinants of Health   Financial Resource Strain: Not on file   Food Insecurity: Not on file  Transportation Needs: Not on file  Physical Activity: Not on file  Stress: Not on file  Social Connections: Not on file  Intimate Partner Violence: Not on file    Past Surgical History:  Procedure Laterality Date  . CORONARY ARTERY BYPASS GRAFT N/A 05/06/2019   Procedure: CORONARY ARTERY BYPASS GRAFTING (CABG) times three using bilateral mammary artery and right saphenous vein harvested with endoscope.;  Surgeon: Alleen Borne, MD;  Location: [facility] OR;  Service: Open Heart Surgery;  Laterality: N/A;  . LEFT HEART CATH AND CORONARY ANGIOGRAPHY N/A 05/05/2019   Procedure: LEFT HEART CATH AND CORONARY ANGIOGRAPHY;  Surgeon: Iran Ouch, MD;  Location: [facility] IN[facility];  Service: Cardiovascular;  Laterality: N/A;  . TEE WITHOUT CARDIOVERSION N/A 05/06/2019   Procedure: TRANSESOPHAGEAL ECHOCARDIOGRAM (TEE);  Surgeon: Alleen Borne, MD;  Location[facility];  Service: Open Heart Surgery;  Laterality: N/A;    Family History  Problem Relation Age of Onset  . Other Mother 13       Shy-Drager Syndrome  . Hypertension Father   . Valvular heart disease Father     Allergies  Allergen Reactions  . Azithromycin Other (See Comments)    Spikes BP    Current Outpatient Medications on File Prior to Visit  Medication Sig Dispense Refill  . Alirocumab (PRALUENT) 150  MG/ML SOAJ Inject 150 mg into the skin every 14 (fourteen) days. 2 pen 11  . amLODipine (NORVASC) 10 MG tablet Take 1 tablet (10 mg total) by mouth daily. 90 tablet 1  . aspirin EC 325 MG EC tablet Take 1 tablet (325 mg total) by mouth daily. 30 tablet 0  . losartan-hydrochlorothiazide (HYZAAR) 100-12.5 MG tablet Take 1 tablet by mouth daily. Please keep upcoming appt for future refills. 1st attempt. 30 tablet 0  . metoprolol tartrate (LOPRESSOR) 50 MG tablet Take 1 tablet (50 mg total) by mouth 2 (two) times daily. 180 tablet 0  . testosterone (ANDROGEL) 50 MG/5GM (1%) GEL Place 5 g onto the skin daily.  150 g 5  . cyclobenzaprine (FLEXERIL) 10 MG tablet Take 10 mg by mouth 3 (three) times daily as needed for muscle spasms.  (Patient not taking: Reported on 03/21/2021)    . fluticasone (FLONASE) 50 MCG/ACT nasal spray Place 1 spray into both nostrils daily.    . Omega-3 Fatty Acids (OMEGA-3 FISH OIL CONCENTRATE) 1000 MG CPDR Take 1,000 mg by mouth daily.    . predniSONE (DELTASONE) 5 MG tablet Take 6 pills for first day, 5 pills second day, 4 pills third day, 3 pills fourth day, 2 pills the fifth day, and 1 pill sixth day. (Patient not taking: Reported on 03/10/2020) 21 tablet 0   No current facility-administered medications on file prior to visit.    BP 128/83   Pulse 71   Resp 18   Ht 5\' 10"  (1.778 m)   Wt 178 lb (80.7 kg)   SpO2 97%   BMI 25.54 kg/m       Objective:   Physical Exam General Mental Status- Alert. General Appearance- Not in acute distress.   Skin General: Color- Normal Color. Moisture- Normal Moisture.  Neck Carotid Arteries- Normal color. Moisture- Normal Moisture. No carotid bruits. No JVD.  Chest and Lung Exam Auscultation: Breath Sounds:-Normal.  Cardiovascular Auscultation:Rythm- Regular. Murmurs & Other Heart Sounds:Auscultation of the heart reveals- No Murmurs.  Abdomen Inspection:-Inspeection Normal. Palpation/Percussion:Note:No mass. Palpation and Percussion of the abdomen reveal- Non Tender, Non Distended + BS, no rebound or guarding.   Neurologic Cranial Nerve exam:- CN III-XII intact(No nystagmus), symmetric smile. Strength:- 5/5 equal and symmetric strength both upper and lower extremities.  Rt knee- mild lateral knee pain on palpation. No crepitus on flexion and extension. No swelling.     Assessment & Plan:  For rt knee pain recommend moderate exercise/not to over exercise presently. Use low dose ibuprofen 200-400 mg every 8 hours.  Will go ahead and refer to sport medicine. If they don't call you by early next week recommend calling  them.  Follow up for wellness exam in about 2-3 months or sooner if needed.  , PA-C

## 2021-03-21 NOTE — Patient Instructions (Addendum)
For rt knee pain recommend moderate exercise/not to over exercise presently. Use low dose ibuprofen 200-400 mg every 8 hours.  Will go ahead and refer to sport medicine. If they don't call you by early next week recommend calling them.  Follow up for wellness exam in about 2-3 months or sooner if needed.

## 2021-04-06 ENCOUNTER — Ambulatory Visit: Payer: Managed Care, Other (non HMO) | Admitting: Cardiovascular Disease

## 2021-04-06 ENCOUNTER — Encounter: Payer: Self-pay | Admitting: Cardiovascular Disease

## 2021-04-06 ENCOUNTER — Other Ambulatory Visit: Payer: Self-pay

## 2021-04-06 DIAGNOSIS — I10 Essential (primary) hypertension: Secondary | ICD-10-CM

## 2021-04-06 DIAGNOSIS — Z951 Presence of aortocoronary bypass graft: Secondary | ICD-10-CM | POA: Diagnosis not present

## 2021-04-06 DIAGNOSIS — E7800 Pure hypercholesterolemia, unspecified: Secondary | ICD-10-CM

## 2021-04-06 NOTE — Assessment & Plan Note (Signed)
History of CAD status post catheterization by Dr. Kirke Corin revealing three-vessel disease with preserved LV function and subsequent coronary artery bypass grafting x3 by Dr. Laneta Simmers 05/06/2019 with a LIMA to the LAD, free RIMA to the first diagonal branch and a vein to the RCA.  He has lost 50 pounds since that time, has changed his diet and runs at least 3 miles 4-5 times a week.  He has no symptoms.

## 2021-04-06 NOTE — Assessment & Plan Note (Signed)
History of essential hypertension a blood pressure measured today at 133/81.  He is on amlodipine, losartan, hydrochlorothiazide and metoprolol.

## 2021-04-06 NOTE — Progress Notes (Signed)
04/06/2021 Dwaine Pringle   03-02-1971  [id number]  Primary Physician Saguier, Kateri Mc Primary Cardiologist: Runell Gess MD Nicholes Calamity, [place]  HPI:  Edward Lawrence is a 50 y.o.  fit appearing married Caucasian male father of 2 children who works as a Gaffer I last saw him in the office 04/04/2020.His risk factors include treated hypertension and hyperlipidemia. There is no family history for heart disease. Never had a heart attack or stroke prior to this. He had accelerated angina for 6 weeks prior to presentation on 5/5. His EKG showed no acute changes. Enzymes are low and flat. His story was convincing and I recommended he undergo diagnostic coronary arteriography which he did by Dr. Kirke Corin the following day revealing three-vessel disease. He had preserved LV function. He had underwent CABG x3 on 05/06/2019 by Dr. Laneta Simmers with a LIMA to the LAD, free RIMA to the first diagonal branch and vein to the RCA. He was discharged home 5 days later. He did participate in cardiac rehab.   Since I saw him a year ago he continues to do well.  He has lost 50 pounds total since his bypass surgery as result of dietary modification and exercise.  He runs 3 miles a day 4 to 5 days a week and he is totally asymptomatic.   Current Meds  Medication Sig  . Alirocumab (PRALUENT) 150 MG/ML SOAJ Inject 150 mg into the skin every 14 (fourteen) days.  Marland Kitchen amLODipine (NORVASC) 10 MG tablet Take 1 tablet (10 mg total) by mouth daily.  Marland Kitchen aspirin EC 325 MG EC tablet Take 1 tablet (325 mg total) by mouth daily.  . cyclobenzaprine (FLEXERIL) 10 MG tablet Take 10 mg by mouth 3 (three) times daily as needed for muscle spasms.  Marland Kitchen losartan-hydrochlorothiazide (HYZAAR) 100-12.5 MG tablet Take 1 tablet by mouth daily. Please keep upcoming appt for future refills. 1st attempt.  . metoprolol tartrate (LOPRESSOR) 50 MG tablet Take 1 tablet (50 mg total) by mouth 2 (two) times daily.  . [DISCONTINUED]  testosterone (ANDROGEL) 50 MG/5GM (1%) GEL Place 5 g onto the skin daily.     Allergies  Allergen Reactions  . Azithromycin Other (See Comments)    Spikes BP    Social History   Socioeconomic History  . Marital status: Married    Spouse name: Not on file  . Number of children: Not on file  . Years of education: Not on file  . Highest education level: Not on file  Occupational History  . Occupation: Risk analyst  Tobacco Use  . Smoking status: Never Smoker  . Smokeless tobacco: Never Used  Vaping Use  . Vaping Use: Never used  Substance and Sexual Activity  . Alcohol use: Never  . Drug use: Never  . Sexual activity: Not on file  Other Topics Concern  . Not on file  Social History Narrative  . Not on file   Social Determinants of Health   Financial Resource Strain: Not on file  Food Insecurity: Not on file  Transportation Needs: Not on file  Physical Activity: Not on file  Stress: Not on file  Social Connections: Not on file  Intimate Partner Violence: Not on file     Review of Systems: General: negative for chills, fever, night sweats or weight changes.  Cardiovascular: negative for chest pain, dyspnea on exertion, edema, orthopnea, palpitations, paroxysmal nocturnal dyspnea or shortness of breath Dermatological: negative for rash Respiratory: negative for cough or wheezing Urologic: negative  for hematuria Abdominal: negative for nausea, vomiting, diarrhea, bright red blood per rectum, melena, or hematemesis Neurologic: negative for visual changes, syncope, or dizziness All other systems reviewed and are otherwise negative except as noted above.    Blood pressure 133/81, pulse (!) 56, height 5\' 10"  (1.778 m), weight 177 lb 6.4 oz (80.5 kg), SpO2 99 %.  General appearance: alert and no distress Neck: no adenopathy, no carotid bruit, no JVD, supple, symmetrical, trachea midline and thyroid not enlarged, symmetric, no tenderness/mass/nodules Lungs: clear to  auscultation bilaterally Heart: regular rate and rhythm, S1, S2 normal, no murmur, click, rub or gallop Extremities: extremities normal, atraumatic, no cyanosis or edema Pulses: 2+ and symmetric Skin: Skin color, texture, turgor normal. No rashes or lesions Neurologic: Alert and oriented X 3, normal strength and tone. Normal symmetric reflexes. Normal coordination and gait  EKG sinus bradycardia 56 without ST or T wave changes.  Personally reviewed this EKG.  ASSESSMENT AND PLAN:   Hypertension History of essential hypertension a blood pressure measured today at 133/81.  He is on amlodipine, losartan, hydrochlorothiazide and metoprolol.  High cholesterol History of hyperlipidemia on Praluent with lipid profile performed 09/01/2020 revealing total cholesterol 127, LDL 70 and HDL 42.  Hx of CABG History of CAD status post catheterization by Dr. 11/01/2020 revealing three-vessel disease with preserved LV function and subsequent coronary artery bypass grafting x3 by Dr. Kirke Corin 05/06/2019 with a LIMA to the LAD, free RIMA to the first diagonal branch and a vein to the RCA.  He has lost 50 pounds since that time, has changed his diet and runs at least 3 miles 4-5 times a week.  He has no symptoms.      07/06/2019 MD FACP,FACC,FAHA, [facility] 04/06/2021 10:42 AM

## 2021-04-06 NOTE — Assessment & Plan Note (Signed)
History of hyperlipidemia on Praluent with lipid profile performed 09/01/2020 revealing total cholesterol 127, LDL 70 and HDL 42.

## 2021-04-06 NOTE — Patient Instructions (Signed)

## 2021-04-09 ENCOUNTER — Other Ambulatory Visit: Payer: Self-pay

## 2021-04-09 ENCOUNTER — Ambulatory Visit: Payer: Managed Care, Other (non HMO) | Admitting: Family Medicine

## 2021-04-09 ENCOUNTER — Encounter: Payer: Self-pay | Admitting: Family Medicine

## 2021-04-09 VITALS — BP 114/84 | Ht 70.0 in | Wt 177.0 lb

## 2021-04-09 DIAGNOSIS — M5416 Radiculopathy, lumbar region: Secondary | ICD-10-CM | POA: Insufficient documentation

## 2021-04-09 NOTE — Patient Instructions (Signed)
Good to see you Please try the exercises   Please send me a message in MyChart with any questions or updates.  Please see [place] back as needed.   --Dr. Jordan Likes

## 2021-04-09 NOTE — Assessment & Plan Note (Signed)
Symptoms seem more associated with a radicular component as opposed to his knee.  May have an association with the peroneal nerve as it courses over the proximal fibular head.  Not likely associated with the knee itself. -Counseled on home exercise therapy and supportive care. -Could consider physical therapy.

## 2021-04-09 NOTE — Progress Notes (Signed)
  Edward Lawrence - 50 y.o. male MRN [id number]  Date of birth: 05-Jul-1971  SUBJECTIVE:  Including CC & ROS.  No chief complaint on file.   Edward Lawrence is a 50 y.o. male that is presenting with altered sensation over the lateral proximal portion of his lower leg.  Has been occurring for about a month now.  Denies any injury or inciting event.  Has no pain with running.  Seems to be worse after driving.   Review of Systems See HPI   HISTORY: Past Medical, Surgical, Social, and Family History Reviewed & Updated per EMR.   Pertinent Historical Findings include:  Past Medical History:  Diagnosis Date  . High cholesterol   . Hypertension     Past Surgical History:  Procedure Laterality Date  . CORONARY ARTERY BYPASS GRAFT N/A 05/06/2019   Procedure: CORONARY ARTERY BYPASS GRAFTING (CABG) times three using bilateral mammary artery and right saphenous vein harvested with endoscope.;  Surgeon: Alleen Borne, MD;  Location: [facility] OR;  Service: Open Heart Surgery;  Laterality: N/A;  . LEFT HEART CATH AND CORONARY ANGIOGRAPHY N/A 05/05/2019   Procedure: LEFT HEART CATH AND CORONARY ANGIOGRAPHY;  Surgeon: Iran Ouch, MD;  Location: [facility] INVASIVE CV LAB;  Service: Cardiovascular;  Laterality: N/A;  . TEE WITHOUT CARDIOVERSION N/A 05/06/2019   Procedure: TRANSESOPHAGEAL ECHOCARDIOGRAM (TEE);  Surgeon: Alleen Borne, MD;  Location: [facility] OR;  Service: Open Heart Surgery;  Laterality: N/A;    Family History  Problem Relation Age of Onset  . Other Mother 85       Shy-Drager Syndrome  . Hypertension Father   . Valvular heart disease Father     Social History   Socioeconomic History  . Marital status: Married    Spouse name: Not on file  . Number of children: Not on file  . Years of education: Not on file  . Highest education level: Not on file  Occupational History  . Occupation: Risk analyst  Tobacco Use  . Smoking status: Never Smoker  . Smokeless tobacco: Never Used  Vaping Use  .  Vaping Use: Never used  Substance and Sexual Activity  . Alcohol use: Never  . Drug use: Never  . Sexual activity: Not on file  Other Topics Concern  . Not on file  Social History Narrative  . Not on file   Social Determinants of Health   Financial Resource Strain: Not on file  Food Insecurity: Not on file  Transportation Needs: Not on file  Physical Activity: Not on file  Stress: Not on file  Social Connections: Not on file  Intimate Partner Violence: Not on file     PHYSICAL EXAM:  VS: BP 114/84 (BP Location: Left Arm, Patient Position: Sitting, Cuff Size: Large)   Ht 5\' 10"  (1.778 m)   Wt 177 lb (80.3 kg)   BMI 25.40 kg/m  Physical Exam Gen: NAD, alert, cooperative with exam, well-appearing MSK:  Right knee:  No effusion. Normal range of motion. No signs of atrophy. Normal strength resistance. Neurovascular intact     ASSESSMENT & PLAN:   Lumbar radiculopathy Symptoms seem more associated with a radicular component as opposed to his knee.  May have an association with the peroneal nerve as it courses over the proximal fibular head.  Not likely associated with the knee itself. -Counseled on home exercise therapy and supportive care. -Could consider physical therapy.

## 2021-04-16 ENCOUNTER — Other Ambulatory Visit: Payer: Self-pay | Admitting: Cardiovascular Disease

## 2021-04-16 NOTE — Telephone Encounter (Signed)
Rx has been sent to the pharmacy electronically. ° °

## 2021-05-14 ENCOUNTER — Other Ambulatory Visit: Payer: Self-pay | Admitting: Cardiovascular Disease

## 2021-05-26 ENCOUNTER — Other Ambulatory Visit: Payer: Self-pay | Admitting: Cardiovascular Disease

## 2021-06-27 ENCOUNTER — Encounter: Payer: Self-pay | Admitting: Medical

## 2021-08-12 ENCOUNTER — Other Ambulatory Visit: Payer: Self-pay | Admitting: Cardiovascular Disease

## 2021-08-23 ENCOUNTER — Ambulatory Visit (HOSPITAL_BASED_OUTPATIENT_CLINIC_OR_DEPARTMENT_OTHER)
Admission: RE | Admit: 2021-08-23 | Discharge: 2021-08-23 | Disposition: A | Discharge status: Home / Self Care | Payer: Managed Care, Other (non HMO) | Source: Ambulatory Visit | Attending: Medical | Admitting: Medical

## 2021-08-23 ENCOUNTER — Ambulatory Visit: Payer: Managed Care, Other (non HMO) | Admitting: Medical

## 2021-08-23 ENCOUNTER — Other Ambulatory Visit: Payer: Self-pay

## 2021-08-23 VITALS — BP 120/80 | HR 60 | Temp 98.1°F | Resp 18 | Ht 70.0 in | Wt 181.0 lb

## 2021-08-23 DIAGNOSIS — R0781 Pleurodynia: Secondary | ICD-10-CM | POA: Diagnosis present

## 2021-08-23 DIAGNOSIS — M546 Pain in thoracic spine: Secondary | ICD-10-CM | POA: Diagnosis not present

## 2021-08-23 DIAGNOSIS — R0789 Other chest pain: Secondary | ICD-10-CM

## 2021-08-23 MED ORDER — CYCLOBENZAPRINE HCL 5 MG PO TABS: 5.0000 mg | ORAL_TABLET | Freq: Every day | ORAL | 0 refills | Status: AC

## 2021-08-23 NOTE — Patient Instructions (Signed)
For rib region pain with some occasional pleuritic pain will get rib series with cxr. Recommend tyring salon pas to mid rib area or [facility]. Can use flexeril low dose at night if needed.  Will follow xray and notify you. PT or refer back to sports med consideration if studies negative and pain persists.   Follow up 10-14 days or sooner if needed(if about the same could update me by my chart and then make decision on referral.)

## 2021-08-23 NOTE — Progress Notes (Signed)
Subjective:    Patient ID: Edward Lawrence, male    DOB: 02/12/1971, 50 y.o.   MRN: [id number]  HPI  Pt states he has constant tight sensation to left upper back and rib area. He states pain in area adjacent to spine. He pint to lower thoracic area. Pt states when he drives has pain. He tries to stretch but pain does not resolve. He does not take any meds except for once a week will take flexeril. Will take 5 mg dose.  This pain has been going on for about 2 years. Back then he reported like light bolt of pain.   He did get mri of his back at that time.  CLINICAL DATA:  Mid back pain   EXAM: MRI THORACIC SPINE WITHOUT CONTRAST   TECHNIQUE: Multiplanar, multisequence MR imaging of the thoracic spine was performed. No intravenous contrast was administered.   COMPARISON:  None.   FINDINGS: Alignment:  Physiologic.   Vertebrae: No fracture, evidence of discitis, or bone lesion.   Cord:  Normal signal and morphology.   Paraspinal and other soft tissues: Negative.   Disc levels:   No disc herniation or stenosis.   IMPRESSION: Normal thoracic spine.  Pt saw sports med MD in the past for this.   Now patient reporting more of pain in mid posterior rib pain. Some pain at time when takes a deep breath.    Review of Systems  Constitutional:  Negative for chills, fatigue and fever.  Respiratory:  Negative for cough, chest tightness, shortness of breath and wheezing.   Cardiovascular:  Negative for chest pain and palpitations.  Gastrointestinal:  Negative for abdominal pain.  Genitourinary:  Negative for dysuria.  Musculoskeletal:  Negative for back pain.  Skin:  Negative for rash.  Neurological:  Negative for dizziness, numbness and headaches.  Hematological:  Negative for adenopathy. Does not bruise/bleed easily.  Psychiatric/Behavioral:  Negative for behavioral problems and confusion.     Past Medical History:  Diagnosis Date   High cholesterol    Hypertension       Social History   Socioeconomic History   Marital status: Married    Spouse name: Not on file   Number of children: Not on file   Years of education: Not on file   Highest education level: Not on file  Occupational History   Occupation: Risk analyst  Tobacco Use   Smoking status: Never   Smokeless tobacco: Never  Vaping Use   Vaping Use: Never used  Substance and Sexual Activity   Alcohol use: Never   Drug use: Never   Sexual activity: Not on file  Other Topics Concern   Not on file  Social History Narrative   Not on file   Social Determinants of Health   Financial Resource Strain: Not on file  Food Insecurity: Not on file  Transportation Needs: Not on file  Physical Activity: Not on file  Stress: Not on file  Social Connections: Not on file  Intimate Partner Violence: Not on file    Past Surgical History:  Procedure Laterality Date   CORONARY ARTERY BYPASS GRAFT N/A 05/06/2019   Procedure: CORONARY ARTERY BYPASS GRAFTING (CABG) times three using bilateral mammary artery and right saphenous vein harvested with endoscope.;  Surgeon: Alleen Borne, MD;  Location: [facility] OR;  Service: Open Heart Surgery;  Laterality: N/A;   LEFT HEART CATH AND CORONARY ANGIOGRAPHY N/A 05/05/2019   Procedure: LEFT HEART CATH AND CORONARY ANGIOGRAPHY;  Surgeon: Iran Ouch,  MD;  Location: [facility] IN[facility];  Service: Cardiovascular;  Laterality: N/A;   TEE WITHOUT CARDIOVERSION N/A 05/06/2019   Procedure: TRANSESOPHAGEAL ECHOCARDIOGRAM (TEE);  Surgeon: Alleen Borne, MD;  Location: [facility] OR;  Service: Open Heart Surgery;  Laterality: N/A;    Family History  Problem Relation Age of Onset   Other Mother 38       Shy-Drager Syndrome   Hypertension Father    Valvular heart disease Father     Allergies  Allergen Reactions   Azithromycin Other (See Comments)    Spikes BP    Current Outpatient Medications on File Prior to Visit  Medication Sig Dispense Refill   amLODipine (NORVASC)  10 MG tablet TAKE 1 TABLET DAILY 90 tablet 1   aspirin EC 325 MG EC tablet Take 1 tablet (325 mg total) by mouth daily. 30 tablet 0   cyclobenzaprine (FLEXERIL) 10 MG tablet Take 10 mg by mouth 3 (three) times daily as needed for muscle spasms.     losartan-hydrochlorothiazide (HYZAAR) 100-12.5 MG tablet TAKE 1 TABLET DAILY 90 tablet 3   metoprolol tartrate (LOPRESSOR) 50 MG tablet TAKE 1 TABLET TWICE A DAY 180 tablet 0   PRALUENT 150 MG/ML SOAJ ADMINISTER 1 ML UNDER THE SKIN EVERY 14 DAYS 2 mL 11   No current facility-administered medications on file prior to visit.    BP 120/80 (BP Location: Left Arm, Patient Position: Sitting, Cuff Size: Normal)   Pulse 60   Temp 98.1 F (36.7 C) (Oral)   Resp 18   Ht 5\' 10"  (1.778 m)   Wt 181 lb (82.1 kg)   SpO2 97%   BMI 25.97 kg/m       Objective:   Physical Exam  General- No acute distress. Pleasant patient. Neck- Full range of motion, no jvd Lungs- Clear, even and unlabored. Heart- regular rate and rhythm. Neurologic- CNII- XII grossly intact.  Thoracic- no mid t-spine tenderness.  Left side posterior rib pain- no rash or vesicles.      Assessment & Plan:   For rib region pain with some occasional pleuritic pain will get rib series with cxr. Recommend tyring salon pas to mid rib area or [facility]acare. Can use flexeril low dose at night if needed.  Will follow xray and notify you. PT or refer back to sports med consideration if studies negative and pain persists.   Follow up 10-14 days or sooner if needed(if about the same could update me by my chart and then make decision on referral.)  , PA-C

## 2021-08-24 NOTE — Progress Notes (Signed)
Left vm to return call. - Viewed via portal.

## 2021-10-16 ENCOUNTER — Other Ambulatory Visit: Payer: Self-pay | Admitting: Cardiovascular Disease

## 2021-11-23 ENCOUNTER — Encounter: Payer: Self-pay | Admitting: Medical

## 2021-11-27 ENCOUNTER — Other Ambulatory Visit: Payer: Self-pay

## 2021-11-27 ENCOUNTER — Ambulatory Visit: Payer: Managed Care, Other (non HMO) | Admitting: Medical

## 2021-11-27 VITALS — BP 132/86 | HR 73 | Resp 18 | Ht 70.0 in | Wt 178.0 lb

## 2021-11-27 DIAGNOSIS — R21 Rash and other nonspecific skin eruption: Secondary | ICD-10-CM

## 2021-11-27 NOTE — Patient Instructions (Addendum)
Rash with features that appear ring worm like. Did not respond to topical cream. Ask you send me message on what you used. Also want you to give me update/copy of most recent labs done at work as want to check liver enzymes.   Plan presently use terbinafine over the counter cream twice daily. If liver enzymes normal consider oral Lamisil very other day for 2 weeks. If rash persists then refer to dermatologist.  Follow up 3 weeks or sooner if needed.   If you work did not do lft then will need to do in our lab.

## 2021-11-27 NOTE — Progress Notes (Signed)
Subjective:    Patient ID: Edward Lawrence, male    DOB: [birth year], 50 y.o.   MRN: [id number]  HPI  Left upper back/scapula area of redness with itching over past 2 months. Small at first and gradually getting larger. Pt tried topical treatment for last 1.5 weeks. Tried over the counter antifungal cream.     Review of Systems  Constitutional:  Negative for chills, fatigue and fever.  Respiratory:  Negative for cough, chest tightness, shortness of breath and wheezing.   Cardiovascular:  Negative for chest pain and palpitations.  Gastrointestinal:  Negative for abdominal pain, constipation, nausea and vomiting.  Musculoskeletal:  Negative for back pain, gait problem and neck pain.  Skin:  Negative for color change, rash and wound.  Neurological:  Negative for dizziness, seizures, syncope, weakness, numbness and headaches.  Hematological:  Negative for adenopathy. Does not bruise/bleed easily.  Psychiatric/Behavioral:  Negative for behavioral problems and confusion.     Past Medical History:  Diagnosis Date   High cholesterol    Hypertension      Social History   Socioeconomic History   Marital status: Married    Spouse name: Not on file   Number of children: Not on file   Years of education: Not on file   Highest education level: Not on file  Occupational History   Occupation: Risk analyst  Tobacco Use   Smoking status: Never   Smokeless tobacco: Never  Vaping Use   Vaping Use: Never used  Substance and Sexual Activity   Alcohol use: Never   Drug use: Never   Sexual activity: Not on file  Other Topics Concern   Not on file  Social History Narrative   Not on file   Social Determinants of Health   Financial Resource Strain: Not on file  Food Insecurity: Not on file  Transportation Needs: Not on file  Physical Activity: Not on file  Stress: Not on file  Social Connections: Not on file  Intimate Partner Violence: Not on file    Past Surgical History:   Procedure Laterality Date   CORONARY ARTERY BYPASS GRAFT N/A 05/06/2019   Procedure: CORONARY ARTERY BYPASS GRAFTING (CABG) times three using bilateral mammary artery and right saphenous vein harvested with endoscope.;  Surgeon: Alleen Borne, MD;  Location: [facility] OR;  Service: Open Heart Surgery;  Laterality: N/A;   LEFT HEART CATH AND CORONARY ANGIOGRAPHY N/A 05/05/2019   Procedure: LEFT HEART CATH AND CORONARY ANGIOGRAPHY;  Surgeon: Iran Ouch, MD;  Location: [facility] IN[facility];  Service: Cardiovascular;  Laterality: N/A;   TEE WITHOUT CARDIOVERSION N/A 05/06/2019   Procedure: TRANSESOPHAGEAL ECHOCARDIOGRAM (TEE);  Surgeon: Alleen Borne, MD;  Location: [facility] OR;  Service: Open Heart Surgery;  Laterality: N/A;    Family History  Problem Relation Age of Onset   Other Mother 43       Shy-Drager Syndrome   Hypertension Father    Valvular heart disease Father     Allergies  Allergen Reactions   Azithromycin Other (See Comments)    Spikes BP    Current Outpatient Medications on File Prior to Visit  Medication Sig Dispense Refill   amLODipine (NORVASC) 10 MG tablet TAKE 1 TABLET DAILY 90 tablet 1   aspirin EC 325 MG EC tablet Take 1 tablet (325 mg total) by mouth daily. 30 tablet 0   cyclobenzaprine (FLEXERIL) 5 MG tablet Take 1 tablet (5 mg total) by mouth at bedtime. 90 tablet 0   losartan-hydrochlorothiazide (HYZAAR)  100-12.5 MG tablet TAKE 1 TABLET DAILY 90 tablet 3   metoprolol tartrate (LOPRESSOR) 50 MG tablet TAKE 1 TABLET TWICE A DAY 180 tablet 1   PRALUENT 150 MG/ML SOAJ ADMINISTER 1 ML UNDER THE SKIN EVERY 14 DAYS 2 mL 11   No current facility-administered medications on file prior to visit.    BP 132/86   Pulse 73   Resp 18   Ht 5\' 10"  (1.778 m)   Wt 178 lb (80.7 kg)   SpO2 98%   BMI 25.54 kg/m        Objective:   Physical Exam  General- No acute distress. Pleasant patient. Neck- Full range of motion, no jvd Lungs- Clear, even and unlabored. Heart- regular  rate and rhythm. Neurologic- CNII- XII grossly intact.  Skin- left upper scapula area 2 cm with with raised borders and central clearing. No warmth or induration/      Assessment & Plan:   Patient Instructions  Rash with features that appear ring worm like. Did not respond to topical cream. Ask you send me message on what you used. Also want you to give me update/copy of most recent labs done at work as want to check liver enzymes.   Plan presently use terbinafine over the counter cream twice daily. If liver enzymes normal consider oral Lamisil very other day for 2 weeks. If rash persists then refer to dermatologist.  Follow up 3 weeks or sooner if needed.   , PA-C

## 2021-11-29 ENCOUNTER — Encounter: Payer: Self-pay | Admitting: Medical

## 2021-11-30 MED ORDER — TERBINAFINE HCL 250 MG PO TABS: 250.0000 mg | ORAL_TABLET | Freq: Every day | ORAL | 0 refills | Status: DC

## 2021-11-30 NOTE — Addendum Note (Signed)
Addended by: Gwenevere Abbot on: 11/30/2021 08:36 AM   Modules accepted: Orders

## 2022-01-05 ENCOUNTER — Encounter: Payer: Self-pay | Admitting: Medical

## 2022-01-07 NOTE — Addendum Note (Signed)
Addended by: Gwenevere Abbot on: 01/07/2022 08:56 AM   Modules accepted: Orders

## 2022-01-07 NOTE — Telephone Encounter (Signed)
Ok to drop referral for derm?

## 2022-02-10 ENCOUNTER — Other Ambulatory Visit: Payer: Self-pay | Admitting: Cardiovascular Disease

## 2022-03-28 ENCOUNTER — Other Ambulatory Visit: Payer: Self-pay | Admitting: Cardiovascular Disease

## 2022-04-16 ENCOUNTER — Other Ambulatory Visit: Payer: Self-pay

## 2022-04-16 DIAGNOSIS — I200 Unstable angina: Secondary | ICD-10-CM

## 2022-04-16 DIAGNOSIS — Z951 Presence of aortocoronary bypass graft: Secondary | ICD-10-CM

## 2022-04-16 DIAGNOSIS — E7800 Pure hypercholesterolemia, unspecified: Secondary | ICD-10-CM

## 2022-05-12 ENCOUNTER — Other Ambulatory Visit: Payer: Self-pay | Admitting: Cardiovascular Disease

## 2022-05-23 ENCOUNTER — Ambulatory Visit: Payer: Managed Care, Other (non HMO) | Admitting: Cardiovascular Disease

## 2022-05-23 ENCOUNTER — Encounter: Payer: Self-pay | Admitting: Cardiovascular Disease

## 2022-05-23 DIAGNOSIS — I10 Essential (primary) hypertension: Secondary | ICD-10-CM | POA: Diagnosis not present

## 2022-05-23 DIAGNOSIS — Z951 Presence of aortocoronary bypass graft: Secondary | ICD-10-CM | POA: Diagnosis not present

## 2022-05-23 DIAGNOSIS — E7800 Pure hypercholesterolemia, unspecified: Secondary | ICD-10-CM

## 2022-05-23 LAB — LIPID PANEL
Chol/HDL Ratio: 2.9 ratio (ref 0.0–5.0)
Cholesterol, Total: 145 mg/dL (ref 100–199)
HDL: 50 mg/dL (ref >39)
LDL Chol Calc (NIH): 78 mg/dL (ref 0–99)
Triglycerides: 87 mg/dL (ref 0–149)
VLDL Cholesterol Cal: 17 mg/dL (ref 5–40)

## 2022-05-23 LAB — HEPATIC FUNCTION PANEL
ALT: 52 IU/L — ABNORMAL HIGH (ref 0–44)
AST: 35 IU/L (ref 0–40)
Albumin: 4.6 g/dL (ref 4.0–5.0)
Alkaline Phosphatase: 69 IU/L (ref 44–121)
Bilirubin Total: 0.7 mg/dL (ref 0.0–1.2)
Bilirubin, Direct: 0.19 mg/dL (ref 0.00–0.40)
Total Protein: 6.9 g/dL (ref 6.0–8.5)

## 2022-05-23 NOTE — Assessment & Plan Note (Signed)
History of hyperlipidemia on Praluent lipid profile performed 09/01/2020 revealing total cholesterol 127, LDL 70 and HDL 42.

## 2022-05-23 NOTE — Patient Instructions (Signed)
Medication Instructions:  °Your physician recommends that you continue on your current medications as directed. Please refer to the Current Medication list given to you today. ° °*If you need a refill on your cardiac medications before your next appointment, please call your pharmacy* ° ° °Lab Work: °Your physician recommends that you have labs drawn today: Lipid/liver profile ° °If you have labs (blood work) drawn today and your tests are completely normal, you will receive your results only by: °MyChart Message (if you have MyChart) OR °A paper copy in the mail °If you have any lab test that is abnormal or we need to change your treatment, we will call you to review the results. ° ° ° °Follow-Up: °At [facility], you and your health needs are our priority.  As part of our continuing mission to provide you with exceptional heart care, we have created designated Provider Care Teams.  These Care Teams include your primary Cardiologist (physician) and Advanced Practice Providers (APPs -  Physician Assistants and Nurse Practitioners) who all work together to provide you with the care you need, when you need it. ° °We recommend signing up for the patient portal called "MyChart".  Sign up information is provided on this After Visit Summary.  MyChart is used to connect with patients for Virtual Visits (Telemedicine).  Patients are able to view lab/test results, encounter notes, upcoming appointments, etc.  Non-urgent messages can be sent to your provider as well.   °To learn more about what you can do with MyChart, go to [email or web address].   ° °Your next appointment:   °12 month(s) ° °The format for your next appointment:   °In Person ° °Provider:   °Jonathan Berry, MD °

## 2022-05-23 NOTE — Assessment & Plan Note (Signed)
History of essential hypertension blood pressure measured today at 126/90.  He is on amlodipine, losartan, hydrochlorothiazide and metoprolol.

## 2022-05-23 NOTE — Progress Notes (Signed)
05/23/2022 Edward Lawrence   August 27, 1971  [id number]  Primary Physician Saguier, Kateri Mc Primary Cardiologist: Runell Gess MD Nicholes Calamity, [place]  HPI:  [name] is a 51 y.o.  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 His risk factors include treated hypertension and hyperlipidemia.  There is no family history for heart disease.  Never had a heart attack or stroke prior to this.  He had accelerated angina for 6 weeks prior to presentation on 5/5.  His EKG showed no acute changes.  Enzymes are low and flat.  His story was convincing and I recommended he undergo diagnostic coronary arteriography which he did by Dr. Kirke Corin the following day revealing three-vessel disease.  He had preserved LV function.  He had underwent CABG x3 on 05/06/2019 by Dr. Laneta Simmers with a LIMA to the LAD, free RIMA to the first diagonal branch and vein to the RCA.  He was discharged home 5 days later.  He did participate in cardiac rehab.    Since I saw him a year ago he continues to do well.  He has maintained his weight loss.  He is very active and exercises 4 to 5 days a week doing recumbent bike, elliptical and weight training at the gym with a trainer.   Current Meds  Medication Sig   amLODipine (NORVASC) 10 MG tablet TAKE 1 TABLET DAILY   aspirin EC 325 MG EC tablet Take 1 tablet (325 mg total) by mouth daily.   cyclobenzaprine (FLEXERIL) 5 MG tablet Take 1 tablet (5 mg total) by mouth at bedtime. (Patient taking differently: Take 5 mg by mouth as needed.)   losartan-hydrochlorothiazide (HYZAAR) 100-12.5 MG tablet TAKE 1 TABLET DAILY   metoprolol tartrate (LOPRESSOR) 50 MG tablet TAKE 1 TABLET TWICE A DAY   PRALUENT 150 MG/ML SOAJ ADMINISTER 1 ML UNDER THE SKIN EVERY 14 DAYS     Allergies  Allergen Reactions   Azithromycin Other (See Comments)    Spikes BP    Social History   Socioeconomic History   Marital status:  Married    Spouse name: Not on file   Number of children: Not on file   Years of education: Not on file   Highest education level: Not on file  Occupational History   Occupation: Risk analyst  Tobacco Use   Smoking status: Never   Smokeless tobacco: Never  Vaping Use   Vaping Use: Never used  Substance and Sexual Activity   Alcohol use: Never   Drug use: Never   Sexual activity: Not on file  Other Topics Concern   Not on file  Social History Narrative   Not on file   Social Determinants of Health   Financial Resource Strain: Not on file  Food Insecurity: Not on file  Transportation Needs: Not on file  Physical Activity: Not on file  Stress: Not on file  Social Connections: Not on file  Intimate Partner Violence: Not on file     Review of Systems: General: negative for chills, fever, night sweats or weight changes.  Cardiovascular: negative for chest pain, dyspnea on exertion, edema, orthopnea, palpitations, paroxysmal nocturnal dyspnea or shortness of breath Dermatological: negative for rash Respiratory: negative for cough or wheezing Urologic: negative for hematuria Abdominal: negative for nausea, vomiting, diarrhea, bright red blood per rectum, melena, or hematemesis Neurologic: negative for visual changes, syncope, or dizziness All other systems reviewed  and are otherwise negative except as noted above.    Blood pressure 126/90, pulse (!) 57, height 5\' 10"  (1.778 m), weight 175 lb 6.4 oz (79.6 kg), SpO2 98 %.  General appearance: alert and no distress Neck: no adenopathy, no carotid bruit, no JVD, supple, symmetrical, trachea midline, and thyroid not enlarged, symmetric, no tenderness/mass/nodules Lungs: clear to auscultation bilaterally Heart: regular rate and rhythm, S1, S2 normal, no murmur, click, rub or gallop Extremities: extremities normal, atraumatic, no cyanosis or edema Pulses: 2+ and symmetric Skin: Skin color, texture, turgor normal. No rashes or  lesions Neurologic: Grossly normal  EKG sinus bradycardia 57 without ST or T wave changes.  Personally reviewed this EKG.  ASSESSMENT AND PLAN:   Hypertension History of essential hypertension blood pressure measured today at 126/90.  He is on amlodipine, losartan, hydrochlorothiazide and metoprolol.  High cholesterol History of hyperlipidemia on Praluent lipid profile performed 09/01/2020 revealing total cholesterol 127, LDL 70 and HDL 42.  Hx of CABG History of CAD status post cardiac catheterization by Dr. 11/01/2020 in the setting of accelerated angina revealing three-vessel disease with preserved LV function.  He had CABG x3 by Dr. Posey Boyer 05/06/2019 with a LIMA to his LAD, free RIMA to the first diagonal branch and a vein to the RCA.  Since that time he is remained asymptomatic and is extremely active.     07/06/2019 MD FACP,FACC,FAHA, [place] 05/23/2022 8:34 AM

## 2022-05-23 NOTE — Assessment & Plan Note (Signed)
History of CAD status post cardiac catheterization by Dr. Posey Boyer in the setting of accelerated angina revealing three-vessel disease with preserved LV function.  He had CABG x3 by Dr. Kateri Plummer 05/06/2019 with a LIMA to his LAD, free RIMA to the first diagonal branch and a vein to the RCA.  Since that time he is remained asymptomatic and is extremely active.

## 2022-07-04 ENCOUNTER — Other Ambulatory Visit: Payer: Self-pay | Admitting: Cardiovascular Disease

## 2022-07-31 ENCOUNTER — Telehealth: Payer: Self-pay | Admitting: Cardiovascular Disease

## 2022-07-31 NOTE — Telephone Encounter (Signed)
Pt c/o medication issue:  1. Name of Medication: PRALUENT 150 MG/ML SOAJ  2. How are you currently taking this medication (dosage and times per day)? Inject every 14 days  3. Are you having a reaction (difficulty breathing--STAT)? no  4. What is your medication issue? Carly with [facility] calling to inform the praluent requires a prior authorization. Phone: [phone] ref: [phone]

## 2022-08-01 MED ORDER — REPATHA SURECLICK 140 MG/ML ~~LOC~~ SOAJ: 1.0000 | SUBCUTANEOUS | 11 refills | Status: DC

## 2022-08-01 NOTE — Telephone Encounter (Signed)
Patient made aware that insurance now prefers Repatha. Rx sent to pharmacy of choice. Gave pt info on how to get copay card.

## 2022-08-01 NOTE — Telephone Encounter (Signed)
Patient's PBM is [employer]. I have a feeling that Repatha is now the preferred. I have started PA for both. Awaiting questions

## 2022-10-24 ENCOUNTER — Other Ambulatory Visit: Payer: Self-pay | Admitting: Cardiovascular Disease

## 2023-03-20 ENCOUNTER — Other Ambulatory Visit: Payer: Self-pay | Admitting: Cardiovascular Disease

## 2023-04-14 ENCOUNTER — Encounter: Payer: Self-pay | Admitting: *Deleted

## 2023-04-24 ENCOUNTER — Other Ambulatory Visit: Payer: Self-pay | Admitting: Cardiovascular Disease

## 2023-05-17 ENCOUNTER — Other Ambulatory Visit: Payer: Self-pay | Admitting: Cardiovascular Disease

## 2023-05-23 NOTE — Progress Notes (Signed)
Cardiology Clinic Note   Patient Name: Wiley Bibey Date of Encounter: 05/27/2023  Primary Care Provider:  Esperanza Richters, PA-C Primary Cardiologist:  Nanetta Batty, MD  Patient Profile    Amie Spawn 52 year old male presents the clinic today for follow-up evaluation of his hypertension and coronary artery disease.  Past Medical History    Past Medical History:  Diagnosis Date   High cholesterol    Hypertension    Past Surgical History:  Procedure Laterality Date   CORONARY ARTERY BYPASS GRAFT N/A 05/06/2019   Procedure: CORONARY ARTERY BYPASS GRAFTING (CABG) times three using bilateral mammary artery and right saphenous vein harvested with endoscope.;  Surgeon: Alleen Borne, MD;  Location: [facility] OR;  Service: Open Heart Surgery;  Laterality: N/A;   LEFT HEART CATH AND CORONARY ANGIOGRAPHY N/A 05/05/2019   Procedure: LEFT HEART CATH AND CORONARY ANGIOGRAPHY;  Surgeon: Iran Ouch, MD;  Location: [facility] INVASIVE CV LAB;  Service: Cardiovascular;  Laterality: N/A;   TEE WITHOUT CARDIOVERSION N/A 05/06/2019   Procedure: TRANSESOPHAGEAL ECHOCARDIOGRAM (TEE);  Surgeon: Alleen Borne, MD;  Location: [facility] OR;  Service: Open Heart Surgery;  Laterality: N/A;    Allergies  Allergies  Allergen Reactions   Azithromycin Other (See Comments)    Spikes BP    History of Present Illness    Zmari Ranger has a PMH of HLD, HTN, and coronary artery disease status post CABG x 3 05/06/2019 by Dr. Laneta Simmers.  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  He was noted to have accelerated angina for 6 weeks prior to his presentation on 05/04/2019.  His EKG showed no acute changes.  His cardiac enzymes were low and flat.  He was evaluated by [facility] and a underwent diagnostic cardiac catheterization by Dr. Kirke Corin.  His catheterization showed three-vessel coronary disease with preserved EF.  He received CABG x 3 LIMA-LAD, RIMA-D1, SVG-RCA.  He progressed well and was discharged 5 days later.  He was seen in follow-up by  Dr. Allyson Sabal on 05/23/2022.  During that time he continued to do well from a cardiac standpoint.  His weight was well-controlled.  He continued to be active exercising 4-5 times per week using a recumbent bike and the elliptical.  He was also working with a Psychologist, educational.  He presents to the clinic today for follow-up evaluation and states he continues to be physically active walking/jogging 4 times per week for about 15 miles total.  He does note that he has been having some IT band pain.  We reviewed his prior echocardiogram.  We reviewed his prior CABG.  He has not had recent lab work drawn.  I will order CBC, BMP, lipids and LFTs, have him continue his current physical activity, and low-sodium diet.  We will plan follow-up in 1 year.  Today he denies chest pain, shortness of breath, lower extremity edema, fatigue, palpitations, melena, hematuria, hemoptysis, diaphoresis, weakness, presyncope, syncope, orthopnea, and PND.    Home Medications    Prior to Admission medications   Medication Sig Start Date End Date Taking? Authorizing Provider  amLODipine (NORVASC) 10 MG tablet TAKE 1 TABLET DAILY 04/24/23   Runell Gess, MD  aspirin EC 325 MG EC tablet Take 1 tablet (325 mg total) by mouth daily. 05/11/19   Ardelle Balls, PA-C  cyclobenzaprine (FLEXERIL) 5 MG tablet Take 1 tablet (5 mg total) by mouth at bedtime. Patient taking differently: Take 5 mg by mouth as needed. 08/23/21   Saguier, Ramon Dredge, PA-C  Evolocumab (REPATHA  SURECLICK) 140 MG/ML SOAJ Inject 1 Pen into the skin every 14 (fourteen) days. 08/01/22   Runell Gess, MD  losartan-hydrochlorothiazide [facility]) 100-12.5 MG tablet TAKE 1 TABLET DAILY 05/19/23   Runell Gess, MD  metoprolol tartrate (LOPRESSOR) 50 MG tablet TAKE 1 TABLET TWICE A DAY 10/25/22   Runell Gess, MD    Family History    Family History  Problem Relation Age of Onset   Other Mother 7       Shy-Drager Syndrome   Hypertension Father    Valvular  heart disease Father    He indicated that his mother is deceased. He indicated that his father is alive.  Social History    Social History   Socioeconomic History   Marital status: Married    Spouse name: Not on file   Number of children: Not on file   Years of education: Not on file   Highest education level: Not on file  Occupational History   Occupation: Risk analyst  Tobacco Use   Smoking status: Never   Smokeless tobacco: Never  Vaping Use   Vaping Use: Never used  Substance and Sexual Activity   Alcohol use: Never   Drug use: Never   Sexual activity: Not on file  Other Topics Concern   Not on file  Social History Narrative   Not on file   Social Determinants of Health   Financial Resource Strain: Not on file  Food Insecurity: Not on file  Transportation Needs: Not on file  Physical Activity: Not on file  Stress: Not on file  Social Connections: Not on file  Intimate Partner Violence: Not on file     Review of Systems    General:  No chills, fever, night sweats or weight changes.  Cardiovascular:  No chest pain, dyspnea on exertion, edema, orthopnea, palpitations, paroxysmal nocturnal dyspnea. Dermatological: No rash, lesions/masses Respiratory: No cough, dyspnea Urologic: No hematuria, dysuria Abdominal:   No nausea, vomiting, diarrhea, bright red blood per rectum, melena, or hematemesis Neurologic:  No visual changes, wkns, changes in mental status. All other systems reviewed and are otherwise negative except as noted above.  Physical Exam    VS:  BP 124/72   Pulse 64   Ht 5\' 10"  (1.778 m)   Wt 184 lb 12.8 oz (83.8 kg)   SpO2 98%   BMI 26.52 kg/m  , BMI Body mass index is 26.52 kg/m. GEN: Well nourished, well developed, in no acute distress. HEENT: normal. Neck: Supple, no JVD, carotid bruits, or masses. Cardiac: RRR, systolic murmur heard along right sternal border 2/6 murmurs, rubs, or gallops. No clubbing, cyanosis, edema.  Radials/DP/PT  2+ and equal bilaterally.  Respiratory:  Respirations regular and unlabored, clear to auscultation bilaterally. GI: Soft, nontender, nondistended, BS + x 4. MS: no deformity or atrophy. Skin: warm and dry, no rash. Neuro:  Strength and sensation are intact. Psych: Normal affect.  Accessory Clinical Findings    Recent Labs: No results found for requested labs within last 365 days.   Recent Lipid Panel    Component Value Date/Time   CHOL 145 05/23/2022 0849   TRIG 87 05/23/2022 0849   HDL 50 05/23/2022 0849   CHOLHDL 2.9 05/23/2022 0849   CHOLHDL 3 03/10/2020 0849   VLDL 10.0 03/10/2020 0849   LDLCALC 78 05/23/2022 0849         ECG personally reviewed by me today-normal sinus rhythm 64 bpm- No acute changes   Echocardiogram 05/05/2019  IMPRESSIONS  1. The left ventricle has normal systolic function, with an ejection  fraction of 55-60%. The cavity size was normal. Left ventricular diastolic  Doppler parameters are consistent with pseudonormal. Technically difficult  study with poor acoustic windows  but no definite wall motion abnormality identified.   2. The right ventricle has normal systolc function. The cavity was  normal.   3. There is mild mitral annular calcification present. No evidence of  mitral valve stenosis. No significant mitral regurgitation.   4. The aortic valve is tricuspid Mild calcification of the aortic valve.  No stenosis of the aortic valve.   5. The aortic root is normal in size and structure.   6. Normal IVC size. No complete TR doppler jet so unable to estimate PA  systolic pressure.   FINDINGS   Left Ventricle: The left ventricle has normal systolic function, with an  ejection fraction of 55-60%. The cavity size was normal. There is no  increase in left ventricular wall thickness. Left ventricular diastolic  Doppler parameters are consistent with  pseudonormal (grade II).     Right Ventricle: The right ventricle has normal systolic  function. The  cavity was normal.   Left Atrium: Left atrial size was normal in size.   Right Atrium: Right atrial size was normal in size. Right atrial pressure  is estimated at 3 mmHg.   Mitral Valve: There is mild mitral annular calcification present. Mitral  valve regurgitation is not visualized by color flow Doppler. No evidence  of mitral valve stenosis.   Tricuspid Valve: The tricuspid valve was normal in structure. Tricuspid  valve regurgitation was not visualized by color flow Doppler.   Aortic Valve: The aortic valve is tricuspid Mild calcification of the  aortic valve. Aortic valve regurgitation was not visualized by color flow  Doppler. There is No stenosis of the aortic valve.   Aorta: The aortic root is normal in size and structure.   Venous: The inferior vena cava is normal in size with greater than 50%  respiratory variability.     Assessment & Plan   1.  Coronary artery disease-denies recent episodes of chest discomfort.  He is status post CABG x 3 on 05/06/2019 by Dr. Evelene Croon. Continue current medical therapy Heart healthy low-sodium diet Maintain physical activity CBC, BMP  Hyperlipidemia-LDL 78 on  05/23/22. High-fiber diet Continue Repatha Repeat fasting lipids and LFTs  Essential hypertension-BP today 124/72. Continue amlodipine, losartan, HCTZ, metoprolol Maintain blood pressure log  Disposition: Follow-up with Dr.Berry or me in 1 year.   Thomasene Ripple. Abrea Henle NP-C     05/27/2023, 9:10 AM [facility] [address] Office [phone] Fax [phone]    I spent 14 minutes examining this patient, reviewing medications, and using patient centered shared decision making involving her cardiac care.  Prior to her visit I spent greater than 20 minutes reviewing her past medical history,  medications, and prior cardiac tests.

## 2023-05-24 ENCOUNTER — Other Ambulatory Visit: Payer: Self-pay | Admitting: Cardiovascular Disease

## 2023-05-27 ENCOUNTER — Ambulatory Visit: Payer: Managed Care, Other (non HMO) | Attending: General Practice | Admitting: General Practice

## 2023-05-27 ENCOUNTER — Encounter: Payer: Self-pay | Admitting: General Practice

## 2023-05-27 VITALS — BP 124/72 | HR 64 | Ht 70.0 in | Wt 184.8 lb

## 2023-05-27 DIAGNOSIS — Z951 Presence of aortocoronary bypass graft: Secondary | ICD-10-CM | POA: Diagnosis not present

## 2023-05-27 DIAGNOSIS — I10 Essential (primary) hypertension: Secondary | ICD-10-CM | POA: Diagnosis not present

## 2023-05-27 DIAGNOSIS — E7800 Pure hypercholesterolemia, unspecified: Secondary | ICD-10-CM

## 2023-05-27 NOTE — Patient Instructions (Signed)
Medication Instructions:  The current medical regimen is effective;  continue present plan and medications as directed. Please refer to the Current Medication list given to you today.  *If you need a refill on your cardiac medications before your next appointment, please call your pharmacy*  Lab Work: FASTING LIPID, LFT, CBC AND BMET If you have labs (blood work) drawn today and your tests are completely normal, you will receive your results only by:  MyChart Message (if you have MyChart) OR  A paper copy in the mail If you have any lab test that is abnormal or we need to change your treatment, we will call you to review the results.  Testing/Procedures: NONE  Follow-Up: At [facility], you and your health needs are our priority.  As part of our continuing mission to provide you with exceptional heart care, we have created designated Provider Care Teams.  These Care Teams include your primary Cardiologist (physician) and Advanced Practice Providers (APPs -  Physician Assistants and Nurse Practitioners) who all work together to provide you with the care you need, when you need it.  Your next appointment:   12 month(s)  Provider:   Nanetta Batty, MD     Other Instructions NONE

## 2023-05-28 LAB — HEPATIC FUNCTION PANEL
ALT: 26 IU/L (ref 0–44)
AST: 20 IU/L (ref 0–40)
Albumin: 4.3 g/dL (ref 4.1–5.1)
Alkaline Phosphatase: 64 IU/L (ref 44–121)
Bilirubin Total: 0.4 mg/dL (ref 0.0–1.2)
Bilirubin, Direct: 0.13 mg/dL (ref 0.00–0.40)
Total Protein: 6.2 g/dL (ref 6.0–8.5)

## 2023-05-28 LAB — CBC
Hematocrit: 45.8 % (ref 37.5–51.0)
Hemoglobin: 14.5 g/dL (ref 13.0–17.7)
MCH: 26.8 pg (ref 26.6–33.0)
MCHC: 31.7 g/dL (ref 31.5–35.7)
MCV: 85 fL (ref 79–97)
Platelets: 218 10*3/uL (ref 150–450)
RBC: 5.42 x10E6/uL (ref 4.14–5.80)
RDW: 13 % (ref 11.6–15.4)
WBC: 5.3 10*3/uL (ref 3.4–10.8)

## 2023-05-28 LAB — BASIC METABOLIC PANEL
BUN/Creatinine Ratio: 17 (ref 9–20)
BUN: 15 mg/dL (ref 6–24)
CO2: 28 mmol/L (ref 20–29)
Calcium: 9.4 mg/dL (ref 8.7–10.2)
Chloride: 103 mmol/L (ref 96–106)
Creatinine, Ser: 0.86 mg/dL (ref 0.76–1.27)
Glucose: 96 mg/dL (ref 70–99)
Potassium: 4.5 mmol/L (ref 3.5–5.2)
Sodium: 141 mmol/L (ref 134–144)
eGFR: 105 mL/min/{1.73_m2} (ref >59)

## 2023-05-28 LAB — LIPID PANEL
Chol/HDL Ratio: 2.9 ratio (ref 0.0–5.0)
Cholesterol, Total: 135 mg/dL (ref 100–199)
HDL: 46 mg/dL (ref >39)
LDL Chol Calc (NIH): 74 mg/dL (ref 0–99)
Triglycerides: 74 mg/dL (ref 0–149)
VLDL Cholesterol Cal: 15 mg/dL (ref 5–40)

## 2023-07-01 ENCOUNTER — Other Ambulatory Visit: Payer: Self-pay | Admitting: Cardiovascular Disease

## 2023-07-24 ENCOUNTER — Other Ambulatory Visit: Payer: Self-pay | Admitting: Cardiovascular Disease

## 2023-09-19 ENCOUNTER — Encounter: Payer: Self-pay | Admitting: Medical

## 2023-09-23 ENCOUNTER — Ambulatory Visit: Payer: Managed Care, Other (non HMO) | Admitting: Medical

## 2023-09-23 ENCOUNTER — Encounter: Payer: Self-pay | Admitting: Medical

## 2023-09-23 VITALS — BP 120/84 | HR 72 | Temp 98.3°F | Resp 16 | Ht 70.0 in | Wt 183.2 lb

## 2023-09-23 DIAGNOSIS — R739 Hyperglycemia, unspecified: Secondary | ICD-10-CM | POA: Diagnosis not present

## 2023-09-23 DIAGNOSIS — T7840XA Allergy, unspecified, initial encounter: Secondary | ICD-10-CM

## 2023-09-23 DIAGNOSIS — R21 Rash and other nonspecific skin eruption: Secondary | ICD-10-CM

## 2023-09-23 DIAGNOSIS — Z1211 Encounter for screening for malignant neoplasm of colon: Secondary | ICD-10-CM

## 2023-09-23 LAB — GLUCOSE, POCT (MANUAL RESULT ENTRY): POC Glucose: 101 mg/dl — AB (ref 70–99)

## 2023-09-23 LAB — HEMOGLOBIN A1C: Hgb A1c MFr Bld: 5.4 % (ref 4.6–6.5)

## 2023-09-23 MED ORDER — HYDROXYZINE HCL 10 MG PO TABS: 10.0000 mg | ORAL_TABLET | Freq: Three times a day (TID) | ORAL | 0 refills | Status: DC | PRN

## 2023-09-23 MED ORDER — PREDNISONE 10 MG (21) PO TBPK: ORAL_TABLET | ORAL | 0 refills | Status: DC

## 2023-09-23 NOTE — Patient Instructions (Addendum)
Rash that itching axillary and on waste. I think best to treat with tapered steroid and rx hydroxyzine low dose. Treat for allergic reaction  If rash persists or if resolved but then reoccurs then consider referral to allergist.  Elevated sugar in past. Today sugar level is office reasonable. Based on sugar elevation history periodically did decide to get A1c/3 month sugar lab. While on prednisone eat low sugar diet.  Follow up date to be determined after treatment and after review of A1c. Update me by my chart in 7-10 days if rash resolved.

## 2023-09-23 NOTE — Addendum Note (Signed)
Addended by: Gwenevere Abbot on: 09/23/2023 09:37 AM   Modules accepted: Orders

## 2023-09-23 NOTE — Progress Notes (Signed)
Subjective:    Patient ID: Edward Lawrence, male    DOB: Jul 13, 1971, 52 y.o.   MRN: [id number]  HPI   Pt in with rash on his arm pits, upper medial arms waist and some on his crotch. Rash has been present for one week. Pt states thought reaction to magnesium supplament packs. Then next day rash under his arms. Also tried different pack of sugar free product.  He states rash is itching a lot.   Pt has tried lidocaine gel, hydrocortisone and other topical med but can remember name. Pt did try benadryl for 2 days but did not help.  Initially pt thought allergic reaction but not so sure about now. No report of anyone else in house with rash.   On review no change of detergents.     Review of Systems  Constitutional:  Negative for chills, fatigue and fever.  Respiratory:  Negative for cough, chest tightness and wheezing.   Cardiovascular:  Negative for chest pain and palpitations.  Gastrointestinal:  Negative for abdominal distention, blood in stool and diarrhea.  Genitourinary:  Negative for dysuria and frequency.  Musculoskeletal:  Negative for back pain, joint swelling and neck stiffness.  Skin:  Positive for rash.  Neurological:  Negative for facial asymmetry, speech difficulty and light-headedness.  Hematological:  Negative for adenopathy. Does not bruise/bleed easily.  Psychiatric/Behavioral:  Negative for behavioral problems and dysphoric mood.     Past Medical History:  Diagnosis Date   High cholesterol    Hypertension      Social History   Socioeconomic History   Marital status: Married    Spouse name: Not on file   Number of children: Not on file   Years of education: Not on file   Highest education level: Not on file  Occupational History   Occupation: Risk analyst  Tobacco Use   Smoking status: Never   Smokeless tobacco: Never  Vaping Use   Vaping status: Never Used  Substance and Sexual Activity   Alcohol use: Never   Drug use: Never   Sexual activity:  Not on file  Other Topics Concern   Not on file  Social History Narrative   Not on file   Social Determinants of Health   Financial Resource Strain: Not on file  Food Insecurity: Not on file  Transportation Needs: Not on file  Physical Activity: Not on file  Stress: Not on file  Social Connections: Not on file  Intimate Partner Violence: Not on file    Past Surgical History:  Procedure Laterality Date   CORONARY ARTERY BYPASS GRAFT N/A 05/06/2019   Procedure: CORONARY ARTERY BYPASS GRAFTING (CABG) times three using bilateral mammary artery and right saphenous vein harvested with endoscope.;  Surgeon: Alleen Borne, MD;  Location: [facility] OR;  Service: Open Heart Surgery;  Laterality: N/A;   LEFT HEART CATH AND CORONARY ANGIOGRAPHY N/A 05/05/2019   Procedure: LEFT HEART CATH AND CORONARY ANGIOGRAPHY;  Surgeon: Iran Ouch, MD;  Location: [facility];  Service: Cardiovascular;  Laterality: N/A;   TEE WITHOUT CARDIOVERSION N/A 05/06/2019   Procedure: TRANSESOPHAGEAL ECHOCARDIOGRAM (TEE);  Surgeon: Alleen Borne, MD;  Location: [facility] OR;  Service: Open Heart Surgery;  Laterality: N/A;    Family History  Problem Relation Age of Onset   Other Mother 3       Shy-Drager Syndrome   Hypertension Father    Valvular heart disease Father     Allergies  Allergen Reactions   Azithromycin Other (  See Comments)    Spikes BP    Current Outpatient Medications on File Prior to Visit  Medication Sig Dispense Refill   amLODipine (NORVASC) 10 MG tablet TAKE 1 TABLET DAILY 30 tablet 11   aspirin EC 325 MG EC tablet Take 1 tablet (325 mg total) by mouth daily. 30 tablet 0   cyclobenzaprine (FLEXERIL) 5 MG tablet Take 1 tablet (5 mg total) by mouth at bedtime. (Patient taking differently: Take 5 mg by mouth as needed.) 90 tablet 0   Evolocumab (REPATHA SURECLICK) 140 MG/ML SOAJ INJECT 1 PEN INTO THE SKIN EVERY 14 (FOURTEEN) DAYS. 6 mL 3   losartan-hydrochlorothiazide (HYZAAR) 100-12.5 MG  tablet TAKE 1 TABLET DAILY 90 tablet 3   metoprolol tartrate (LOPRESSOR) 50 MG tablet TAKE 1 TABLET TWICE A DAY 180 tablet 2   No current facility-administered medications on file prior to visit.    BP 120/84 (BP Location: Left Arm, Patient Position: Sitting, Cuff Size: Normal)   Pulse 72   Temp 98.3 F (36.8 C) (Oral)   Resp 16   Ht 5\' 10"  (1.778 m)   Wt 183 lb 3.2 oz (83.1 kg)   SpO2 97%   BMI 26.29 kg/m        Objective:   Physical Exam  General Mental Status- Alert. General Appearance- Not in acute distress.   Skin Moderate diffuse scatttered papular rash both axillary area with some into medial bicep area. On waist scattered rash around waist.   Neck Carotid Arteries- Normal color. Moisture- Normal Moisture. No carotid bruits. No JVD.  Chest and Lung Exam Auscultation: Breath Sounds:-Normal.  Cardiovascular Auscultation:Rythm- Regular. Murmurs & Other Heart Sounds:Auscultation of the heart reveals- No Murmurs.  Abdomen Inspection:-Inspeection Normal. Palpation/Percussion:Note:No mass. Palpation and Percussion of the abdomen reveal- Non Tender, Non Distended + BS, no rebound or guarding.  Neurologic Cranial Nerve exam:- CN III-XII intact(No nystagmus), symmetric smile. Strength:- 5/5 equal and symmetric strength both upper and lower extremities.       Assessment & Plan:   Patient Instructions  Rash that itching axillary and on waste. I think best to treat with tapered steroid and rx hydroxyzine low dose. Treat for allergic reaction  If rash persists or if resolved but then reoccurs then consider referral to allergist.  Elevated sugar in past. Today sugar level is office reasonable. Based on sugar elevation history periodically did decide to get A1c/3 month sugar lab. While on prednisone eat low sugar diet.  Follow up date to be determined after treatment and after review of A1c. Update me by my chart in 7-10 days if rash  resolved.               Esperanza Richters, PA-C

## 2023-09-30 MED ORDER — HYDROXYZINE HCL 10 MG PO TABS: 10.0000 mg | ORAL_TABLET | Freq: Three times a day (TID) | ORAL | 0 refills | Status: AC | PRN

## 2023-09-30 MED ORDER — METHYLPREDNISOLONE 4 MG PO TABS: 4.0000 mg | ORAL_TABLET | Freq: Every day | ORAL | 0 refills | Status: AC

## 2023-09-30 NOTE — Addendum Note (Signed)
Addended by: Gwenevere Abbot on: 09/30/2023 08:11 PM   Modules accepted: Orders

## 2023-10-02 ENCOUNTER — Other Ambulatory Visit: Payer: Self-pay

## 2023-10-02 ENCOUNTER — Ambulatory Visit: Payer: Managed Care, Other (non HMO) | Admitting: Internal Medicine

## 2023-10-02 ENCOUNTER — Encounter: Payer: Self-pay | Admitting: Internal Medicine

## 2023-10-02 VITALS — BP 128/72 | HR 76 | Temp 98.2°F | Resp 18 | Ht 68.0 in | Wt 181.7 lb

## 2023-10-02 DIAGNOSIS — L235 Allergic contact dermatitis due to other chemical products: Secondary | ICD-10-CM

## 2023-10-02 DIAGNOSIS — J310 Chronic rhinitis: Secondary | ICD-10-CM | POA: Diagnosis not present

## 2023-10-02 MED ORDER — HYDROCORTISONE 2.5 % EX OINT: TOPICAL_OINTMENT | CUTANEOUS | 3 refills | Status: AC

## 2023-10-02 MED ORDER — TRIAMCINOLONE ACETONIDE 0.1 % EX OINT: TOPICAL_OINTMENT | CUTANEOUS | 1 refills | Status: AC

## 2023-10-02 MED ORDER — PREDNISONE 10 MG PO TABS: ORAL_TABLET | ORAL | 0 refills | Status: AC

## 2023-10-02 NOTE — Progress Notes (Signed)
NEW PATIENT Date of Service/Encounter:  10/02/23 Referring provider: Marisue Lawrence Primary care provider: Esperanza Richters, PA-C  Subjective:  Edward Lawrence is a 52 y.o. male with a PMHx of unstable angina, hypertension, hyperglycemia history of CABG, lumbar radiculopathy presenting today for evaluation of rash History obtained from: chart review and patient.   Discussed the use of AI scribe software for clinical note transcription with the patient, who gave verbal consent to proceed.  History of Present Illness   The patient, with a history of seasonal allergies, presents with a generalized, pruritic rash that first appeared approximately three weeks ago. The rash, described as 'spread out in spots' rather than hives, is primarily located on the torso but has been moving around to different areas, including the neck and waist. The patient reports that the rash tends to persist in one area for several days before resolving and appearing in a new location. Notably, the armpits have been consistently affected for a couple of weeks.  The patient sought care from his PA, who prescribed a course of prednisone and hydroxyzine. The rash initially improved with this treatment but returned within 24 hours of discontinuing the medications. The patient has attempted to identify potential triggers by eliminating new items from his routine, including a new deodorant, but the rash has persisted.  The only previous similar skin reaction was in childhood, in response to salt water taffy, which resulted in hives. The patient has avoided this food since then. He typically manages his seasonal allergies with over-the-counter Zyrtec, starting in late February or March. The patient also reported a possible intolerance to Z-Pak, which was associated with elevated blood pressure and anxiety.     Chart Review:  Reviewed PCP notes from referral over telephone encounter on 09/19/2023: Patient reported rash in  different areas of his body, recommended to start Benadryl every 8 hours.  Patient again wrote back on 09/30/2023 reporting rash returned.  Provider sent in Medrol pack and hydroxyzine.  Referred to allergy  Past Medical History: Past Medical History:  Diagnosis Date   High cholesterol    Hypertension    Medication List:  Current Outpatient Medications  Medication Sig Dispense Refill   amLODipine (NORVASC) 10 MG tablet TAKE 1 TABLET DAILY 30 tablet 11   aspirin EC 325 MG EC tablet Take 1 tablet (325 mg total) by mouth daily. 30 tablet 0   cyclobenzaprine (FLEXERIL) 5 MG tablet Take 1 tablet (5 mg total) by mouth at bedtime. (Patient taking differently: Take 5 mg by mouth as needed.) 90 tablet 0   Evolocumab (REPATHA SURECLICK) 140 MG/ML SOAJ INJECT 1 PEN INTO THE SKIN EVERY 14 (FOURTEEN) DAYS. 6 mL 3   hydrOXYzine (ATARAX) 10 MG tablet Take 1 tablet (10 mg total) by mouth 3 (three) times daily as needed for itching. 30 tablet 0   losartan-hydrochlorothiazide (HYZAAR) 100-12.5 MG tablet TAKE 1 TABLET DAILY 90 tablet 3   methylPREDNISolone (MEDROL) 4 MG tablet Take 1 tablet (4 mg total) by mouth daily. 6 tablet 0   metoprolol tartrate (LOPRESSOR) 50 MG tablet TAKE 1 TABLET TWICE A DAY 180 tablet 2   No current facility-administered medications for this visit.   Known Allergies:  Allergies  Allergen Reactions   Azithromycin Other (See Comments)    Spikes BP, increase heart rate, anxiety   Past Surgical History: Past Surgical History:  Procedure Laterality Date   CORONARY ARTERY BYPASS GRAFT N/A 05/06/2019   Procedure: CORONARY ARTERY BYPASS GRAFTING (CABG) times three using  bilateral mammary artery and right saphenous vein harvested with endoscope.;  Surgeon: Alleen Borne, MD;  Location: [facility] OR;  Service: Open Heart Surgery;  Laterality: N/A;   LEFT HEART CATH AND CORONARY ANGIOGRAPHY N/A 05/05/2019   Procedure: LEFT HEART CATH AND CORONARY ANGIOGRAPHY;  Surgeon: Iran Ouch,  MD;  Location: [facility];  Service: Cardiovascular;  Laterality: N/A;   open heart surgery  2022   TEE WITHOUT CARDIOVERSION N/A 05/06/2019   Procedure: TRANSESOPHAGEAL ECHOCARDIOGRAM (TEE);  Surgeon: Alleen Borne, MD;  Location: [facility] OR;  Service: Open Heart Surgery;  Laterality: N/A;   Family History: Family History  Problem Relation Age of Onset   Other Mother 52       Shy-Drager Syndrome   Hypertension Father    Valvular heart disease Father    Social History: Edward Lawrence lives in a house built 35 years ago, no water damage, carpet in the bedroom, Architectural technologist, central AC, indoor dogs, no roaches, works as an Lawyer, 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  [place]   ROS:  All other systems negative except as noted per HPI.  Objective:  Blood pressure 128/72, pulse 76, temperature 98.2 F (36.8 C), temperature source Temporal, resp. rate 18, height 5\' 8"  (1.727 m), weight 181 lb 11.2 oz (82.4 kg), SpO2 97%. Body mass index is 27.63 kg/m. Physical Exam:  General Appearance:  Alert, cooperative, no distress, appears stated age  Head:  Normocephalic, without obvious abnormality, atraumatic  Eyes:  Conjunctiva clear, EOM's intact  Ears EACs normal bilaterally and normal TMs bilaterally  Nose: Nares normal, normal mucosa and no visible anterior polyps  Throat: Lips, tongue normal; teeth and gums normal, normal posterior oropharynx  Neck: Supple, symmetrical  Lungs:   clear to auscultation bilaterally, Respirations unlabored, no coughing  Heart:  regular rate and rhythm and no murmur, Appears well perfused  Extremities: No edema  Skin: Erythematous patches slightly raised covering waist area, upper extremities, lower back  Neurologic: No gross deficits   Diagnostics: Labs:  Lab Orders  No laboratory test(s) ordered today   Assessment and Plan  Assessment and Plan    Rash-suspected Contact Dermatitis Rash temporarily improved with prednisone  and hydroxyzine but returned after discontinuation. Possible contact allergen could be new deodorant, which has been discontinued for a week. -Discontinue current prednisone regimen. -Start new prednisone taper regimen. (Take 60 mg (6 tablets) by mouth daily for 3 days, then 50 mg (5 tablets) daily for 3 days, then 40 mg (4 tablets) daily for 3 days, then 30 mg (3 tablets) daily for 3 days, then 20 mg (2 tablets) daily for 3 days, then 10 mg (1 tablet) daily for 3 days then stop.) -Continue hydroxyzine at night for itching. -Use Zyrtec during the day for itching, up to 2 tablets daily as needed. -Apply triamcinolone ointment to itchy areas no more than twice a day on body - do not use on face, groin or arm pits -Apply hydrocortisone 2.5% twice daily as needed to rash on face, armpits, groin for itching. -Use hypoallergenic products until cause of rash is determined. -Return in 4 weeks for patch testing once rash has cleared.  Must have been off prednisone 30 days before we can do patch testing. (Must be off topical steroids for 2 weeks prior to patch testing)  Chronic rhinitis - suspected Seasonal Allergies Reports symptoms in the spring, managed with over-the-counter Zyrtec. -Continue current management plan.  - consider testing for environmental allergens if  not controlled  Follow up : 6-8 weeks, sooner if needed It was a pleasure meeting you in clinic today! Thank you for allowing me to participate in your care.   This note in its entirety was forwarded to the Provider who requested this consultation.  Other: samples provided of: vanicream, all free and clear detergents.  Thank you for your kind referral. I appreciate the opportunity to take part in Hong's care. Please do not hesitate to contact me with questions.  Sincerely,  Tonny Bollman, MD [facility]

## 2023-10-02 NOTE — Patient Instructions (Addendum)
Rash-suspected Contact Dermatitis Rash temporarily improved with prednisone and hydroxyzine but returned after discontinuation. Possible contact allergen could be new deodorant, which has been discontinued for a week. -Discontinue current prednisone regimen. -Start new prednisone taper regimen. (Take 60 mg (6 tablets) by mouth daily for 3 days, then 50 mg (5 tablets) daily for 3 days, then 40 mg (4 tablets) daily for 3 days, then 30 mg (3 tablets) daily for 3 days, then 20 mg (2 tablets) daily for 3 days, then 10 mg (1 tablet) daily for 3 days then stop.) -Continue hydroxyzine at night for itching. -Use Zyrtec during the day for itching, up to 2 tablets daily as needed. -Apply triamcinolone ointment to itchy areas no more than twice a day on body - do not use on face, groin or arm pits -Apply hydrocortisone 2.5% twice daily as needed to rash on face, armpits, groin for itching. -Use hypoallergenic products until cause of rash is determined. -Return in 4 weeks for patch testing once rash has cleared.  Must have been off prednisone 30 days before we can do patch testing. (Must be off topical steroids for 2 weeks prior to patch testing)  Seasonal Allergies Reports symptoms in the spring, managed with over-the-counter Zyrtec. -Continue current management plan.  - consider testing for environmental allergens if not controlled  Follow up : 6-8 weeks, sooner if needed It was a pleasure meeting you in clinic today! Thank you for allowing me to participate in your care.  Tonny Bollman, MD [facility]

## 2024-04-12 ENCOUNTER — Telehealth: Payer: Self-pay | Admitting: Cardiovascular Disease

## 2024-04-12 DIAGNOSIS — Z131 Encounter for screening for diabetes mellitus: Secondary | ICD-10-CM

## 2024-04-12 DIAGNOSIS — E7800 Pure hypercholesterolemia, unspecified: Secondary | ICD-10-CM

## 2024-04-12 NOTE — Telephone Encounter (Signed)
  Patient is asking to have his lipd and A1C checked before his appointment on 06/08/24. Needs up to date orders.

## 2024-04-14 NOTE — Telephone Encounter (Signed)
 Spine.  Okay to order

## 2024-04-14 NOTE — Telephone Encounter (Signed)
 Attempted to call patient, no answer left message requesting a call back.     Lab orders placed for Lipid and A1c

## 2024-04-15 NOTE — Telephone Encounter (Signed)
 Patient identification verified by 2 forms. Hilton Lucky, RN    Called and spoke to patient  Informed patient lab order placed  Patient stated he will complete at new office  Informed patient RN will send MyChart message with office location  Patient has no further questions at this time

## 2024-05-25 ENCOUNTER — Other Ambulatory Visit: Payer: Self-pay | Admitting: Cardiovascular Disease

## 2024-05-26 LAB — HEMOGLOBIN A1C
Est. average glucose Bld gHb Est-mCnc: 103 mg/dL
Hgb A1c MFr Bld: 5.2 % (ref 4.8–5.6)

## 2024-05-27 LAB — LIPID PANEL
Chol/HDL Ratio: 3.4 ratio (ref 0.0–5.0)
Cholesterol, Total: 148 mg/dL (ref 100–199)
HDL: 44 mg/dL (ref >39)
LDL Chol Calc (NIH): 84 mg/dL (ref 0–99)
Triglycerides: 111 mg/dL (ref 0–149)
VLDL Cholesterol Cal: 20 mg/dL (ref 5–40)

## 2024-06-08 ENCOUNTER — Ambulatory Visit: Attending: Cardiovascular Disease | Admitting: Cardiovascular Disease

## 2024-06-08 ENCOUNTER — Encounter: Payer: Self-pay | Admitting: Cardiovascular Disease

## 2024-06-08 VITALS — BP 120/80 | HR 74 | Ht 70.0 in | Wt 185.0 lb

## 2024-06-08 DIAGNOSIS — Z79899 Other long term (current) drug therapy: Secondary | ICD-10-CM | POA: Diagnosis not present

## 2024-06-08 DIAGNOSIS — Z951 Presence of aortocoronary bypass graft: Secondary | ICD-10-CM | POA: Diagnosis not present

## 2024-06-08 DIAGNOSIS — I10 Essential (primary) hypertension: Secondary | ICD-10-CM

## 2024-06-08 DIAGNOSIS — E7800 Pure hypercholesterolemia, unspecified: Secondary | ICD-10-CM

## 2024-06-08 NOTE — Assessment & Plan Note (Signed)
 History of CAD status post CABG x 3 by Dr. Sherene Dilling 05/06/2019 after cardiac catheterization performed by Dr. Alvenia Aus at [place] [facility] with three-vessel disease.  He is active and asymptomatic.  He had a LIMA to his LAD, free RIMA to a diagonal branch and vein to the RCA.

## 2024-06-08 NOTE — Assessment & Plan Note (Signed)
 History of essential hypertension blood pressure measured today 120/80.  He is on amlodipine , losartan , hydrochlorothiazide  and metoprolol .

## 2024-06-08 NOTE — Patient Instructions (Signed)
 Medication Instructions:  No changes *If you need a refill on your cardiac medications before your next appointment, please call your pharmacy*  Lab Work: Fasting Lipid and liver- 3 months. Fasting lab work- please come to our lab on first floor or any [facility]. Nothing to eat after midnight- water is ok.  If you have labs (blood work) drawn today and your tests are completely normal, you will receive your results only by: MyChart Message (if you have MyChart) OR A paper copy in the mail If you have any lab test that is abnormal or we need to change your treatment, we will call you to review the results.  Follow-Up: At [facility], you and your health needs are our priority.  As part of our continuing mission to provide you with exceptional heart care, our providers are all part of one team.  This team includes your primary Cardiologist (physician) and Advanced Practice Providers or APPs (Physician Assistants and Nurse Practitioners) who all work together to provide you with the care you need, when you need it.  Your next appointment:   1 yr Edward Pray, NP  2 yr [name]  We recommend signing up for the patient portal called "MyChart".  Sign up information is provided on this After Visit Summary.  MyChart is used to connect with patients for Virtual Visits (Telemedicine).  Patients are able to view lab/test results, encounter notes, upcoming appointments, etc.  Non-urgent messages can be sent to your provider as well.   To learn more about what you can do with MyChart, go to [email or web address].

## 2024-06-08 NOTE — Progress Notes (Signed)
 06/08/2024 Edward Lawrence   1971-07-28  [id number]  Primary Physician Saguier, [place] Primary Cardiologist: Avanell Leigh MD Bennye Bravo, [place]  HPI:  Edward Lawrence is a 53 y.o.    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  His risk factors include treated hypertension and hyperlipidemia.  There is no family history for heart disease.  Never had a heart attack or stroke prior to this.  He had accelerated angina for 6 weeks prior to presentation on 5/5.  His EKG showed no acute changes.  Enzymes are low and flat.  His story was convincing and I recommended he undergo diagnostic coronary arteriography which he did by Dr. Alvenia Aus the following day revealing three-vessel disease.  He had preserved LV function.  He had underwent CABG x3 on 05/06/2019 by Dr. Sherene Dilling with a LIMA to the LAD, free RIMA to the first diagonal branch and vein to the RCA.  He was discharged home 5 days later.  He did participate in cardiac rehab.    Since I saw him in the office 2 years ago he is remained stable.  He still exercises regularly 4 to 5 days a week doing recumbent bike, elliptical and weight training.  He no longer uses a trainer.  He is completely asymptomatic.   Current Meds  Medication Sig   amLODipine  (NORVASC ) 10 MG tablet TAKE 1 TABLET DAILY   atenolol  (TENORMIN ) 100 MG tablet Take 100 mg by mouth daily.   cyclobenzaprine  (FLEXERIL ) 10 MG tablet Take 10 mg by mouth.   Evolocumab  (REPATHA  SURECLICK) 140 MG/ML SOAJ INJECT 1 PEN INTO THE SKIN EVERY 14 (FOURTEEN) DAYS.   losartan -hydrochlorothiazide  (HYZAAR) 100-12.5 MG tablet TAKE 1 TABLET DAILY   metoprolol  tartrate (LOPRESSOR ) 50 MG tablet TAKE 1 TABLET TWICE A DAY     Allergies  Allergen Reactions   Azithromycin Other (See Comments)    Spikes BP, increase heart rate, anxiety    Social History   Socioeconomic History   Marital status: Married     Spouse name: Not on file   Number of children: Not on file   Years of education: Not on file   Highest education level: Not on file  Occupational History   Occupation: Risk analyst  Tobacco Use   Smoking status: Never   Smokeless tobacco: Never  Vaping Use   Vaping status: Never Used  Substance and Sexual Activity   Alcohol use: Never   Drug use: Never   Sexual activity: Not on file  Other Topics Concern   Not on file  Social History Narrative   Not on file   Social Drivers of Health   Financial Resource Strain: Not on file  Food Insecurity: Not on file  Transportation Needs: Not on file  Physical Activity: Not on file  Stress: Not on file  Social Connections: Not on file  Intimate Partner Violence: Not on file     Review of Systems: General: negative for chills, fever, night sweats or weight changes.  Cardiovascular: negative for chest pain, dyspnea on exertion, edema, orthopnea, palpitations, paroxysmal nocturnal dyspnea or shortness of breath Dermatological: negative for rash Respiratory: negative for cough or wheezing Urologic: negative for hematuria Abdominal: negative for nausea, vomiting, diarrhea, bright red blood per rectum, melena, or hematemesis Neurologic: negative for visual changes, syncope, or dizziness All other systems reviewed and are otherwise negative except as noted above.  Blood pressure 120/80, pulse 74, height 5\' 10"  (1.778 m), weight 185 lb (83.9 kg), SpO2 98%.  General appearance: alert and no distress Neck: no adenopathy, no carotid bruit, no JVD, supple, symmetrical, trachea midline, and thyroid not enlarged, symmetric, no tenderness/mass/nodules Lungs: clear to auscultation bilaterally Heart: regular rate and rhythm, S1, S2 normal, no murmur, click, rub or gallop Extremities: extremities normal, atraumatic, no cyanosis or edema Pulses: 2+ and symmetric Skin: Skin color, texture, turgor normal. No rashes or lesions Neurologic:  Grossly normal  EKG EKG Interpretation Date/Time:  Tuesday June 08 2024 08:59:40 EDT Ventricular Rate:  74 PR Interval:  174 QRS Duration:  92 QT Interval:  394 QTC Calculation: 437 R Axis:   80  Text Interpretation: Normal sinus rhythm Normal ECG When compared with ECG of 16-May-2019 13:24, PREVIOUS ECG IS PRESENT Confirmed by Lauro Portal [phone]) on 06/08/2024 9:02:45 AM    ASSESSMENT AND PLAN:   Hypertension History of essential hypertension blood pressure measured today 120/80.  He is on amlodipine , losartan , hydrochlorothiazide  and metoprolol .  High cholesterol History of hyperlipidemia on statin therapy in the past currently on Repatha  with lipid profile most recently performed 05/26/2024 revealing total cholesterol 148, LDL of 84 HDL of 44.  He is not at goal for secondary prevention considering his bypass surgery 5 years ago.  Does admit to some dietary discretion.  He did tolerate statins in the past.  I am going to recheck a fasting lipid liver profile in 3 months.  If his LDL is not less than 70 we will add back a low-dose statin drug.  Hx of CABG History of CAD status post CABG x 3 by Dr. Sherene Dilling 05/06/2019 after cardiac catheterization performed by Dr. Alvenia Aus at [facility] with three-vessel disease.  He is active and asymptomatic.  He had a LIMA to his LAD, free RIMA to a diagonal branch and vein to the RCA.     Avanell Leigh MD FACP,FACC,FAHA, [facility] 06/08/2024 9:19 AM

## 2024-06-08 NOTE — Assessment & Plan Note (Signed)
 History of hyperlipidemia on statin therapy in the past currently on Repatha  with lipid profile most recently performed 05/26/2024 revealing total cholesterol 148, LDL of 84 HDL of 44.  He is not at goal for secondary prevention considering his bypass surgery 5 years ago.  Does admit to some dietary discretion.  He did tolerate statins in the past.  I am going to recheck a fasting lipid liver profile in 3 months.  If his LDL is not less than 70 we will add back a low-dose statin drug.

## 2024-07-24 ENCOUNTER — Encounter: Payer: Self-pay | Admitting: Cardiovascular Disease

## 2024-07-26 MED ORDER — REPATHA SURECLICK 140 MG/ML ~~LOC~~ SOAJ: 140.0000 mg | SUBCUTANEOUS | 3 refills | Status: AC

## 2024-08-06 ENCOUNTER — Other Ambulatory Visit: Payer: Self-pay | Admitting: Cardiovascular Disease

## 2024-10-11 ENCOUNTER — Encounter: Payer: Self-pay | Admitting: Cardiovascular Disease

## 2024-11-02 ENCOUNTER — Ambulatory Visit: Payer: Self-pay | Admitting: Cardiovascular Disease

## 2024-11-02 LAB — LIPID PANEL
Chol/HDL Ratio: 2.9 ratio (ref 0.0–5.0)
Cholesterol, Total: 124 mg/dL (ref 100–199)
HDL: 43 mg/dL (ref >39)
LDL Chol Calc (NIH): 68 mg/dL (ref 0–99)
Triglycerides: 60 mg/dL (ref 0–149)
VLDL Cholesterol Cal: 13 mg/dL (ref 5–40)

## 2024-11-02 LAB — HEPATIC FUNCTION PANEL
ALT: 18 IU/L (ref 0–44)
AST: 18 IU/L (ref 0–40)
Albumin: 4.2 g/dL (ref 3.8–4.9)
Alkaline Phosphatase: 59 IU/L (ref 47–123)
Bilirubin Total: 0.5 mg/dL (ref 0.0–1.2)
Bilirubin, Direct: 0.16 mg/dL (ref 0.00–0.40)
Total Protein: 6 g/dL (ref 6.0–8.5)
